# Patient Record
Sex: Female | Born: 1955 | Race: Black or African American | Hispanic: No | Marital: Single | Smoking: Never smoker
Health system: Southern US, Community
[De-identification: ages and names within clinical notes are randomized; demographics above are authoritative.]

## PROBLEM LIST (undated history)

## (undated) DIAGNOSIS — E7800 Pure hypercholesterolemia, unspecified: Secondary | ICD-10-CM

## (undated) DIAGNOSIS — R600 Localized edema: Secondary | ICD-10-CM

## (undated) DIAGNOSIS — E119 Type 2 diabetes mellitus without complications: Secondary | ICD-10-CM

## (undated) DIAGNOSIS — I10 Essential (primary) hypertension: Secondary | ICD-10-CM

---

## 1978-10-20 HISTORY — PX: ABDOMINAL HYSTERECTOMY: SHX81

## 1978-10-20 HISTORY — PX: NEPHRECTOMY: SHX65

## 1998-08-28 ENCOUNTER — Emergency Department (HOSPITAL_COMMUNITY): Admission: EM | Admit: 1998-08-28 | Discharge: 1998-08-28 | Payer: Self-pay | Admitting: Emergency Medicine

## 2000-01-01 ENCOUNTER — Emergency Department (HOSPITAL_COMMUNITY): Admission: EM | Admit: 2000-01-01 | Discharge: 2000-01-01 | Payer: Self-pay | Admitting: Emergency Medicine

## 2001-02-20 ENCOUNTER — Emergency Department (HOSPITAL_COMMUNITY): Admission: EM | Admit: 2001-02-20 | Discharge: 2001-02-20 | Payer: Self-pay | Admitting: *Deleted

## 2003-02-02 ENCOUNTER — Emergency Department (HOSPITAL_COMMUNITY): Admission: EM | Admit: 2003-02-02 | Discharge: 2003-02-02 | Payer: Self-pay | Admitting: Emergency Medicine

## 2009-01-15 ENCOUNTER — Emergency Department (HOSPITAL_COMMUNITY): Admission: EM | Admit: 2009-01-15 | Discharge: 2009-01-15 | Payer: Self-pay | Admitting: Emergency Medicine

## 2014-12-07 DIAGNOSIS — E1165 Type 2 diabetes mellitus with hyperglycemia: Secondary | ICD-10-CM | POA: Insufficient documentation

## 2014-12-13 DIAGNOSIS — E7800 Pure hypercholesterolemia, unspecified: Secondary | ICD-10-CM | POA: Insufficient documentation

## 2014-12-13 DIAGNOSIS — I10 Essential (primary) hypertension: Secondary | ICD-10-CM | POA: Insufficient documentation

## 2016-10-05 ENCOUNTER — Ambulatory Visit (HOSPITAL_COMMUNITY)
Admission: EM | Admit: 2016-10-05 | Discharge: 2016-10-05 | Disposition: A | Discharge status: Home / Self Care | Payer: BLUE CROSS/BLUE SHIELD | Attending: Emergency Medicine | Admitting: Emergency Medicine

## 2016-10-05 ENCOUNTER — Encounter (HOSPITAL_COMMUNITY): Payer: Self-pay | Admitting: Emergency Medicine

## 2016-10-05 DIAGNOSIS — L03213 Periorbital cellulitis: Secondary | ICD-10-CM

## 2016-10-05 HISTORY — DX: Pure hypercholesterolemia, unspecified: E78.00

## 2016-10-05 MED ORDER — CEPHALEXIN 500 MG PO CAPS: 500.0000 mg | ORAL_CAPSULE | Freq: Four times a day (QID) | ORAL | 0 refills | Status: DC

## 2016-10-05 NOTE — Discharge Instructions (Signed)
You have a little infection of the skin of your eyelid. Keflex 4 times a day for 1 week. Continue the cold compresses to help with the swelling. You should see improvement within 24-48 hours of starting antibiotics, hopefully sooner. Follow-up as needed.

## 2016-10-05 NOTE — ED Triage Notes (Signed)
The [facility] with a complaint of right eye swelling for four days. The patient denied any known injury. The patient stated that she has tried benadryl cream and cold compresses.

## 2016-10-05 NOTE — ED Provider Notes (Signed)
[facility]    CSN: [id number] Arrival date & time: 10/05/16  1200     History   Chief Complaint Chief Complaint  Patient presents with  . Eye Problem    HPI Patricia Mooney is a 60 y.o. female.   HPI  She is a 60 year old woman here for evaluation of right eyelid swelling. This is been going on for 4 days and gradually been getting worse. She has tried Benadryl cream, cold compresses, cucumbers, steak, and potatoes without improvement. She denies any redness or pain of the eye. She states the vision is sometimes a little blurred, but not consistently. Denies any pain, but does state it is a little uncomfortable. No fevers. No known injury or trauma.  Past Medical History:  Diagnosis Date  . Diabetes mellitus without complication (HCC)   . Hypercholesteremia     There are no active problems to display for this patient.   Past Surgical History:  Procedure Laterality Date  . NEPHRECTOMY Right 1980  . PARTIAL HYSTERECTOMY  1980    OB History    No data available       Home Medications    Prior to Admission medications   Medication Sig Start Date End Date Taking? Authorizing Provider  cetirizine (ZYRTEC) 10 MG tablet Take 10 mg by mouth daily.   Yes Historical Provider, MD  cholecalciferol (VITAMIN D) 1000 units tablet Take 1,000 Units by mouth daily.   Yes Historical Provider, MD  glipiZIDE (GLUCOTROL) 5 MG tablet Take by mouth daily before breakfast.   Yes Historical Provider, MD  Insulin Detemir (LEVEMIR) 100 UNIT/ML Pen Inject into the skin daily at 10 pm.   Yes Historical Provider, MD  metFORMIN (GLUCOPHAGE) 1000 MG tablet Take 1,000 mg by mouth 2 (two) times daily with a meal.   Yes Historical Provider, MD  simvastatin (ZOCOR) 20 MG tablet Take 20 mg by mouth daily.   Yes Historical Provider, MD  vitamin B-12 (CYANOCOBALAMIN) 100 MCG tablet Take 100 mcg by mouth daily.   Yes Historical Provider, MD  cephALEXin (KEFLEX) 500 MG capsule Take 1 capsule  (500 mg total) by mouth 4 (four) times daily. 10/05/16   Charm RingsErin J Honig, MD    Family History History reviewed. No pertinent family history.  Social History Social History  Substance Use Topics  . Smoking status: Never Smoker  . Smokeless tobacco: Never Used  . Alcohol use No     Allergies   Codeine   Review of Systems Review of Systems As in history of present illness  Physical Exam Triage Vital Signs ED Triage Vitals  Enc Vitals Group     BP      Pulse      Resp      Temp      Temp src      SpO2      Weight      Height      Head Circumference      Peak Flow      Pain Score      Pain Loc      Pain Edu?      Excl. in GC?    No data found.   Updated Vital Signs BP 133/75 (BP Location: Right Arm)   Pulse 85   Temp 98.3 F (36.8 C) (Oral)   Resp 18   SpO2 100%   Visual Acuity Right Eye Distance: 20/25 Left Eye Distance: 20/25 Bilateral Distance: 20/25  Right Eye Near:  Left Eye Near:    Bilateral Near:     Physical Exam  Constitutional: She is oriented to person, place, and time. She appears well-developed and well-nourished. No distress.  Eyes: Conjunctivae and EOM are normal. Pupils are equal, round, and reactive to light. Right eye exhibits no discharge. Left eye exhibits no discharge.  Right lower eyelid is swollen with erythema. Slight involvement of upper eyelid.  Cardiovascular: Normal rate.   Pulmonary/Chest: Effort normal.  Neurological: She is alert and oriented to person, place, and time.     UC Treatments / Results  Labs (all labs ordered are listed, but only abnormal results are displayed) Labs Reviewed - No data to display  EKG  EKG Interpretation None       Radiology No results found.  Procedures Procedures (including critical care time)  Medications Ordered in UC Medications - No data to display   Initial Impression / Assessment and Plan / UC Course  I have reviewed the triage vital signs and the nursing  notes.  Pertinent labs & imaging results that were available during my care of the patient were reviewed by me and considered in my medical decision making (see chart for details).  Clinical Course     Treatment for cellulitis with Keflex. Continue cold compresses. Expect improvement in 24-48 hours. Follow-up as needed.  Final Clinical Impressions(s) / UC Diagnoses   Final diagnoses:  Preseptal cellulitis of right lower eyelid    New Prescriptions New Prescriptions   CEPHALEXIN (KEFLEX) 500 MG CAPSULE    Take 1 capsule (500 mg total) by mouth 4 (four) times daily.     Charm RingsErin J Honig, MD 10/05/16 1235

## 2016-10-06 ENCOUNTER — Ambulatory Visit (HOSPITAL_COMMUNITY)
Admission: EM | Admit: 2016-10-06 | Discharge: 2016-10-06 | Disposition: A | Discharge status: Home / Self Care | Payer: BLUE CROSS/BLUE SHIELD | Attending: Family Medicine | Admitting: Family Medicine

## 2016-10-06 ENCOUNTER — Encounter (HOSPITAL_COMMUNITY): Payer: Self-pay | Admitting: Emergency Medicine

## 2016-10-06 DIAGNOSIS — L03213 Periorbital cellulitis: Secondary | ICD-10-CM | POA: Diagnosis not present

## 2016-10-06 DIAGNOSIS — B308 Other viral conjunctivitis: Secondary | ICD-10-CM | POA: Diagnosis not present

## 2016-10-06 MED ORDER — POLYMYXIN B-TRIMETHOPRIM 10000-0.1 UNIT/ML-% OP SOLN: 1.0000 [drp] | OPHTHALMIC | 0 refills | Status: DC

## 2016-10-06 MED ORDER — SULFAMETHOXAZOLE-TRIMETHOPRIM 800-160 MG PO TABS: 1.0000 | ORAL_TABLET | Freq: Two times a day (BID) | ORAL | 0 refills | Status: DC

## 2016-10-06 MED ORDER — CEFTRIAXONE SODIUM 1 G IJ SOLR
INTRAMUSCULAR | Status: AC
  Filled 2016-10-06: qty 10

## 2016-10-06 MED ORDER — CEFTRIAXONE SODIUM 1 G IJ SOLR
1.0000 g | Freq: Once | INTRAMUSCULAR | Status: AC
  Administered 2016-10-06: 1 g via INTRAMUSCULAR

## 2016-10-06 MED ORDER — LIDOCAINE HCL (PF) 1 % IJ SOLN
INTRAMUSCULAR | Status: AC
  Filled 2016-10-06: qty 2

## 2016-10-06 NOTE — ED Triage Notes (Signed)
Spoke to [employer]bill oxford, np about patient.  Oxford, NP saw patient's eye and that patient is worse today than yesterday and did start medications yesterday.  [facility].

## 2016-10-06 NOTE — Discharge Instructions (Signed)
If swelling worsens then need to go to ED.

## 2016-10-06 NOTE — ED Provider Notes (Signed)
CSN: [id number]     Arrival date & time 10/06/16  1024 History   First MD Initiated Contact with Patient 10/06/16 1101     Chief Complaint  Patient presents with  . Eye Problem   (Consider location/radiation/quality/duration/timing/severity/associated sxs/prior Treatment) Patient was seen yesterday for right periorbital swelling and cellulitis and was started on keflex rx and the cellulitis has worsened.  She states she has discharge or cold from the right eye.   The history is provided by the patient.  Eye Problem  Location:  Right eye Severity:  Mild Onset quality:  Sudden Duration:  2 days Timing:  Constant Progression:  Worsening Chronicity:  New Relieved by:  Nothing Worsened by:  Nothing Associated symptoms: crusting and itching     Past Medical History:  Diagnosis Date  . Diabetes mellitus without complication (HCC)   . Hypercholesteremia    Past Surgical History:  Procedure Laterality Date  . NEPHRECTOMY Right 1980  . PARTIAL HYSTERECTOMY  1980   No family history on file. Social History  Substance Use Topics  . Smoking status: Never Smoker  . Smokeless tobacco: Never Used  . Alcohol use No   OB History    No data available     Review of Systems  Constitutional: Negative.   Eyes: Positive for itching.  Respiratory: Negative.   Cardiovascular: Negative.   Gastrointestinal: Negative.   Endocrine: Negative.   Genitourinary: Negative.   Musculoskeletal: Negative.   Skin:       Right periorbital swelling and erythema.  Allergic/Immunologic: Negative.   Neurological: Negative.   Hematological: Negative.   Psychiatric/Behavioral: Negative.     Allergies  Codeine  Home Medications   Prior to Admission medications   Medication Sig Start Date End Date Taking? Authorizing Provider  cephALEXin (KEFLEX) 500 MG capsule Take 1 capsule (500 mg total) by mouth 4 (four) times daily. 10/05/16   Charm RingsErin J Honig, MD  cetirizine (ZYRTEC) 10 MG tablet Take 10 mg  by mouth daily.    Historical Provider, MD  cholecalciferol (VITAMIN D) 1000 units tablet Take 1,000 Units by mouth daily.    Historical Provider, MD  glipiZIDE (GLUCOTROL) 5 MG tablet Take by mouth daily before breakfast.    Historical Provider, MD  Insulin Detemir (LEVEMIR) 100 UNIT/ML Pen Inject into the skin daily at 10 pm.    Historical Provider, MD  metFORMIN (GLUCOPHAGE) 1000 MG tablet Take 1,000 mg by mouth 2 (two) times daily with a meal.    Historical Provider, MD  simvastatin (ZOCOR) 20 MG tablet Take 20 mg by mouth daily.    Historical Provider, MD  sulfamethoxazole-trimethoprim (BACTRIM DS,SEPTRA DS) 800-160 MG tablet Take 1 tablet by mouth 2 (two) times daily. 10/06/16 10/13/16  Deatra CanterWilliam J Oxford, FNP  trimethoprim-polymyxin b (POLYTRIM) ophthalmic solution Place 1 drop into the right eye every 4 (four) hours. 10/06/16   Deatra CanterWilliam J Oxford, FNP  vitamin B-12 (CYANOCOBALAMIN) 100 MCG tablet Take 100 mcg by mouth daily.    Historical Provider, MD   Meds Ordered and Administered this Visit   Medications  cefTRIAXone (ROCEPHIN) injection 1 g (1 g Intramuscular Given 10/06/16 1131)    BP 153/83 (BP Location: Right Arm) Comment (BP Location): large cuff  Pulse 76   Temp 99.2 F (37.3 C) (Oral)   Resp 20   SpO2 100%  No data found.   Physical Exam  Constitutional: She appears well-developed and well-nourished.  HENT:  Head: Normocephalic and atraumatic.  Mouth/Throat: Oropharynx is clear and moist.  Eyes: EOM are normal. Pupils are equal, round, and reactive to light. Right eye exhibits discharge.  Right conjunctiva with erythema and white discharge.  Cardiovascular: Normal rate, regular rhythm and normal heart sounds.   Pulmonary/Chest: Effort normal and breath sounds normal.  Abdominal: Soft. Bowel sounds are normal.  Skin:  Right periorbital swelling and erythema.  Nursing note and vitals reviewed.   Urgent Care Course   Clinical Course     Procedures (including  critical care time)  Labs Review Labs Reviewed - No data to display  Imaging Review No results found.   Visual Acuity Review  Right Eye Distance:   Left Eye Distance:   Bilateral Distance:    Right Eye Near:   Left Eye Near:    Bilateral Near:         MDM  1. Cellulitis right periorbital area - Continue Keflex rx And add Bactrim DS one po bid x 10 days Rocephin 1 gm IM today  2.  Conjunctivitis right eye - Polytrim gtt's 1-2 gtt's q 4 hours.  Explained to patient to go to ED if right eye worsens.    Deatra CanterWilliam J Oxford, FNP 10/06/16 [phone]

## 2016-10-06 NOTE — ED Triage Notes (Signed)
Right eye swelling is worse than yesterday.  Unable to do visual acuity, patient cannot open this eye.  Area around eye is red, swollen

## 2016-10-07 ENCOUNTER — Inpatient Hospital Stay (HOSPITAL_COMMUNITY)
Admission: EM | Admit: 2016-10-07 | Discharge: 2016-10-09 | DRG: 603 | Disposition: A | Discharge status: Home / Self Care | Payer: BLUE CROSS/BLUE SHIELD | Attending: Internal Medicine | Admitting: Internal Medicine

## 2016-10-07 ENCOUNTER — Encounter (HOSPITAL_COMMUNITY): Payer: Self-pay

## 2016-10-07 ENCOUNTER — Emergency Department (HOSPITAL_COMMUNITY): Payer: BLUE CROSS/BLUE SHIELD

## 2016-10-07 DIAGNOSIS — N183 Chronic kidney disease, stage 3 (moderate): Secondary | ICD-10-CM | POA: Diagnosis present

## 2016-10-07 DIAGNOSIS — E118 Type 2 diabetes mellitus with unspecified complications: Secondary | ICD-10-CM | POA: Diagnosis not present

## 2016-10-07 DIAGNOSIS — E7800 Pure hypercholesterolemia, unspecified: Secondary | ICD-10-CM | POA: Diagnosis present

## 2016-10-07 DIAGNOSIS — E1122 Type 2 diabetes mellitus with diabetic chronic kidney disease: Secondary | ICD-10-CM | POA: Diagnosis present

## 2016-10-07 DIAGNOSIS — Z794 Long term (current) use of insulin: Secondary | ICD-10-CM

## 2016-10-07 DIAGNOSIS — H5711 Ocular pain, right eye: Secondary | ICD-10-CM | POA: Diagnosis present

## 2016-10-07 DIAGNOSIS — E119 Type 2 diabetes mellitus without complications: Secondary | ICD-10-CM | POA: Diagnosis not present

## 2016-10-07 DIAGNOSIS — I10 Essential (primary) hypertension: Secondary | ICD-10-CM

## 2016-10-07 DIAGNOSIS — L03213 Periorbital cellulitis: Secondary | ICD-10-CM | POA: Diagnosis present

## 2016-10-07 DIAGNOSIS — Z885 Allergy status to narcotic agent status: Secondary | ICD-10-CM

## 2016-10-07 DIAGNOSIS — Z905 Acquired absence of kidney: Secondary | ICD-10-CM | POA: Diagnosis not present

## 2016-10-07 DIAGNOSIS — E785 Hyperlipidemia, unspecified: Secondary | ICD-10-CM | POA: Diagnosis not present

## 2016-10-07 HISTORY — DX: Localized edema: R60.0

## 2016-10-07 HISTORY — DX: Type 2 diabetes mellitus without complications: E11.9

## 2016-10-07 LAB — CBC WITH DIFFERENTIAL/PLATELET
BASOS PCT: 0 %
Basophils Absolute: 0 10*3/uL (ref 0.0–0.1)
Eosinophils Absolute: 0.1 10*3/uL (ref 0.0–0.7)
Eosinophils Relative: 1 %
HEMATOCRIT: 37.2 % (ref 36.0–46.0)
HEMOGLOBIN: 12.2 g/dL (ref 12.0–15.0)
LYMPHS ABS: 2.4 10*3/uL (ref 0.7–4.0)
Lymphocytes Relative: 33 %
MCH: 26 pg (ref 26.0–34.0)
MCHC: 32.8 g/dL (ref 30.0–36.0)
MCV: 79.1 fL (ref 78.0–100.0)
MONOS PCT: 5 %
Monocytes Absolute: 0.4 10*3/uL (ref 0.1–1.0)
NEUTROS ABS: 4.5 10*3/uL (ref 1.7–7.7)
NEUTROS PCT: 61 %
Platelets: 230 10*3/uL (ref 150–400)
RBC: 4.7 MIL/uL (ref 3.87–5.11)
RDW: 14.1 % (ref 11.5–15.5)
WBC: 7.4 10*3/uL (ref 4.0–10.5)

## 2016-10-07 LAB — BASIC METABOLIC PANEL
Anion gap: 7 (ref 5–15)
BUN: 15 mg/dL (ref 6–20)
CHLORIDE: 101 mmol/L (ref 101–111)
CO2: 27 mmol/L (ref 22–32)
CREATININE: 1.26 mg/dL — AB (ref 0.44–1.00)
Calcium: 9.8 mg/dL (ref 8.9–10.3)
GFR calc non Af Amer: 45 mL/min — ABNORMAL LOW (ref >60)
GFR, EST AFRICAN AMERICAN: 52 mL/min — AB (ref >60)
Glucose, Bld: 179 mg/dL — ABNORMAL HIGH (ref 65–99)
POTASSIUM: 4.3 mmol/L (ref 3.5–5.1)
Sodium: 135 mmol/L (ref 135–145)

## 2016-10-07 LAB — GLUCOSE, CAPILLARY
Glucose-Capillary: 122 mg/dL — ABNORMAL HIGH (ref 65–99)
Glucose-Capillary: 145 mg/dL — ABNORMAL HIGH (ref 65–99)

## 2016-10-07 MED ORDER — ONDANSETRON HCL 4 MG PO TABS: 4.0000 mg | ORAL_TABLET | Freq: Four times a day (QID) | ORAL | Status: DC | PRN

## 2016-10-07 MED ORDER — VITAMIN B-12 100 MCG PO TABS
100.0000 ug | ORAL_TABLET | Freq: Every day | ORAL | Status: DC
Start: 2016-10-07 — End: 2016-10-09
  Administered 2016-10-08: 100 ug via ORAL
  Filled 2016-10-07 (×3): qty 1

## 2016-10-07 MED ORDER — CLINDAMYCIN PHOSPHATE 600 MG/50ML IV SOLN
600.0000 mg | Freq: Three times a day (TID) | INTRAVENOUS | Status: DC
  Administered 2016-10-08 – 2016-10-09 (×4): 600 mg via INTRAVENOUS
  Filled 2016-10-07 (×7): qty 50

## 2016-10-07 MED ORDER — ENOXAPARIN SODIUM 40 MG/0.4ML ~~LOC~~ SOLN
40.0000 mg | SUBCUTANEOUS | Status: DC
  Administered 2016-10-07: 40 mg via SUBCUTANEOUS
  Filled 2016-10-07: qty 0.4

## 2016-10-07 MED ORDER — ONDANSETRON HCL 4 MG/2ML IJ SOLN: 4.0000 mg | Freq: Four times a day (QID) | INTRAMUSCULAR | Status: DC | PRN

## 2016-10-07 MED ORDER — POLYMYXIN B-TRIMETHOPRIM 10000-0.1 UNIT/ML-% OP SOLN
1.0000 [drp] | OPHTHALMIC | Status: DC
  Administered 2016-10-07 – 2016-10-09 (×10): 1 [drp] via OPHTHALMIC
  Filled 2016-10-07: qty 10

## 2016-10-07 MED ORDER — ACETAMINOPHEN 650 MG RE SUPP: 650.0000 mg | Freq: Four times a day (QID) | RECTAL | Status: DC | PRN

## 2016-10-07 MED ORDER — LORATADINE 10 MG PO TABS
10.0000 mg | ORAL_TABLET | Freq: Every day | ORAL | Status: DC
  Administered 2016-10-07 – 2016-10-08 (×2): 10 mg via ORAL
  Filled 2016-10-07 (×3): qty 1

## 2016-10-07 MED ORDER — SODIUM CHLORIDE 0.9 % IV BOLUS (SEPSIS)
500.0000 mL | Freq: Once | INTRAVENOUS | Status: AC
  Administered 2016-10-07: 500 mL via INTRAVENOUS

## 2016-10-07 MED ORDER — INSULIN ASPART 100 UNIT/ML ~~LOC~~ SOLN
0.0000 [IU] | Freq: Three times a day (TID) | SUBCUTANEOUS | Status: DC
  Administered 2016-10-08: 2 [IU] via SUBCUTANEOUS
  Administered 2016-10-08: 1 [IU] via SUBCUTANEOUS
  Administered 2016-10-08: 2 [IU] via SUBCUTANEOUS
  Administered 2016-10-09: 1 [IU] via SUBCUTANEOUS

## 2016-10-07 MED ORDER — ACETAMINOPHEN 325 MG PO TABS: 650.0000 mg | ORAL_TABLET | Freq: Four times a day (QID) | ORAL | Status: DC | PRN

## 2016-10-07 MED ORDER — CLINDAMYCIN PHOSPHATE 600 MG/50ML IV SOLN
600.0000 mg | Freq: Once | INTRAVENOUS | Status: AC
  Administered 2016-10-07: 600 mg via INTRAVENOUS

## 2016-10-07 MED ORDER — SODIUM CHLORIDE 0.9 % IV SOLN
INTRAVENOUS | Status: DC
  Administered 2016-10-07 – 2016-10-08 (×2): 10 mL/h via INTRAVENOUS

## 2016-10-07 MED ORDER — OXYCODONE HCL 5 MG PO TABS: 5.0000 mg | ORAL_TABLET | ORAL | Status: DC | PRN

## 2016-10-07 MED ORDER — SIMVASTATIN 20 MG PO TABS
20.0000 mg | ORAL_TABLET | Freq: Every day | ORAL | Status: DC
  Administered 2016-10-07 – 2016-10-08 (×2): 20 mg via ORAL
  Filled 2016-10-07 (×3): qty 1

## 2016-10-07 MED ORDER — ALBUTEROL SULFATE (2.5 MG/3ML) 0.083% IN NEBU: 2.5000 mg | INHALATION_SOLUTION | RESPIRATORY_TRACT | Status: DC | PRN

## 2016-10-07 MED ORDER — VITAMIN D 1000 UNITS PO TABS
1000.0000 [IU] | ORAL_TABLET | Freq: Every day | ORAL | Status: DC
Start: 2016-10-07 — End: 2016-10-09
  Administered 2016-10-07 – 2016-10-08 (×2): 1000 [IU] via ORAL
  Filled 2016-10-07 (×3): qty 1

## 2016-10-07 MED ORDER — INSULIN DETEMIR 100 UNIT/ML ~~LOC~~ SOLN
42.0000 [IU] | Freq: Every morning | SUBCUTANEOUS | Status: DC
  Administered 2016-10-08: 42 [IU] via SUBCUTANEOUS
  Filled 2016-10-07 (×2): qty 0.42

## 2016-10-07 MED ORDER — IOPAMIDOL (ISOVUE-300) INJECTION 61%
INTRAVENOUS | Status: AC
  Administered 2016-10-07: 75 mL via INTRAVENOUS
  Filled 2016-10-07: qty 75

## 2016-10-07 MED ORDER — CLINDAMYCIN PHOSPHATE 600 MG/50ML IV SOLN
600.0000 mg | Freq: Once | INTRAVENOUS | Status: AC
  Administered 2016-10-07: 600 mg via INTRAVENOUS
  Filled 2016-10-07: qty 50

## 2016-10-07 NOTE — ED Triage Notes (Signed)
Per Pt, Pt was [facility] after being seen at Urgent Care on Monday and Sunday with no relief from antibiotics. Pt has swelling noted to her right face that has worsened over the past few days. Denies fevers.

## 2016-10-07 NOTE — H&P (Signed)
HISTORY AND PHYSICAL       PATIENT DETAILS Name: Patricia Mooney Age: 60 y.o. Sex: female Date of Birth: Apr 27, 1956 Admit Date: 10/07/2016 ZOX:WRUEA,[id number], MD   Patient coming from: Home   CHIEF COMPLAINT:  Right eye swelling  HPI: Patricia Mooney is a 60 y.o. female with medical history significant of type 2 diabetes, dyslipidemia who presents to the hospital for evaluation of right eye pain and swelling. Per patient approximately this past Friday, she started having swelling around her right eye. This gradually worsened, and she went to a local urgent care twice and was prescribed Keflex one time, the second time Bactrim apparently was added. In spite of 2 oral antibiotics, however right eye swelling continued to worsen, she then went to a local ophthalmologists office who asked her to go to the ED for further evaluation and treatment.  Patient denies any fever She denies any headache Patient denies any nausea, vomiting  ED Course:  CT of the orbits was negative for abscess. CBC/chemistries were unremarkable. Patient was given 1 dose of IV clindamycin-following which-during my evaluation patient claimed that the right eye swelling had already started to improve and she could open her eyes somewhat.  Note: Lives at: Home Mobility:  Independent Chronic Indwelling Foley:no   REVIEW OF SYSTEMS:  Constitutional:   No  weight loss, night sweats,  Fevers, chills, fatigue.  HEENT:    No headaches, Dysphagia,Tooth/dental problems,Sore throat  Cardio-vascular: No chest pain,Orthopnea, PND,lower extremity edema, anasarca, palpitations  GI:  No heartburn, indigestion, abdominal pain, nausea, vomiting, diarrhea, melena or hematochezia  Resp: No shortness of breath, cough, hemoptysis,plueritic chest pain.   Skin:  No rash or lesions.  GU:  No dysuria, change in color of urine, no urgency or frequency.  No flank pain.  Musculoskeletal: No joint pain or  swelling.  No decreased range of motion.  No back pain.  Endocrine: No heat intolerance, no cold intolerance, no polyuria, no polydipsia  Psych: No change in mood or affect. No depression or anxiety.  No memory loss.   ALLERGIES:   Allergies  Allergen Reactions  . Codeine Nausea Only    PAST MEDICAL HISTORY: Past Medical History:  Diagnosis Date  . Diabetes mellitus without complication (HCC)   . Hypercholesteremia     PAST SURGICAL HISTORY: Past Surgical History:  Procedure Laterality Date  . NEPHRECTOMY Right 1980  . PARTIAL HYSTERECTOMY  1980    MEDICATIONS AT HOME: Prior to Admission medications   Medication Sig Start Date End Date Taking? Authorizing Provider  cephALEXin (KEFLEX) 500 MG capsule Take 1 capsule (500 mg total) by mouth 4 (four) times daily. 10/05/16  Yes Charm Rings, MD  cetirizine (ZYRTEC) 10 MG tablet Take 10 mg by mouth daily.   Yes Historical Provider, MD  cholecalciferol (VITAMIN D) 1000 units tablet Take 1,000 Units by mouth daily.   Yes Historical Provider, MD  glipiZIDE (GLUCOTROL) 5 MG tablet Take by mouth daily before breakfast.   Yes Historical Provider, MD  Insulin Detemir (LEVEMIR) 100 UNIT/ML Pen Inject 42 Units into the skin daily at 10 pm.    Yes Historical Provider, MD  metFORMIN (GLUCOPHAGE) 1000 MG tablet Take 1,000 mg by mouth 2 (two) times daily with a meal.   Yes Historical Provider, MD  simvastatin (ZOCOR) 20 MG tablet Take 20 mg by mouth daily.   Yes Historical Provider, MD  sulfamethoxazole-trimethoprim (BACTRIM DS,SEPTRA DS) 800-160 MG tablet Take 1 tablet by  mouth 2 (two) times daily. 10/06/16 10/13/16 Yes Deatra CanterWilliam J Oxford, FNP  trimethoprim-polymyxin b (POLYTRIM) ophthalmic solution Place 1 drop into the right eye every 4 (four) hours. 10/06/16  Yes Deatra CanterWilliam J Oxford, FNP  vitamin B-12 (CYANOCOBALAMIN) 100 MCG tablet Take 100 mcg by mouth daily.   Yes Historical Provider, MD    FAMILY HISTORY: Denies any family history of  head or neck cancer. No family history of CAD.  SOCIAL HISTORY:  reports that she has never smoked. She has never used smokeless tobacco. She reports that she does not drink alcohol or use drugs.  PHYSICAL EXAM: Blood pressure 131/65, pulse 80, temperature 98.2 F (36.8 C), temperature source Oral, resp. rate 17, height 5\' 7"  (1.702 m), weight 81.6 kg (180 lb), SpO2 96 %.  General appearance :Awake, alert, not in any distress. Speech Clear.  Eyes: Right eye periorbital swelling-EOM intact. Sclera white in color. HEENT: Atraumatic and Normocephalic Neck: supple, no JVD. No cervical lymphadenopathy. No thyromegaly Resp:Good air entry bilaterally, no added sounds  CVS: S1 S2 regular, no murmurs.  GI: Bowel sounds present, Non tender and not distended with no gaurding, rigidity or rebound.No organomegaly Extremities: B/L Lower Ext shows no edema, both legs are warm to touch Neurology:  speech clear,Non focal, sensation is grossly intact. Psychiatric: Normal judgment and insight. Alert and oriented x 3. Normal mood. Musculoskeletal:No digital cyanosis Skin:No Rash, warm and dry Wounds:N/A  LABS ON ADMISSION:  I have personally reviewed following labs and imaging studies  CBC:  Recent Labs Lab 10/07/16 1223  WBC 7.4  NEUTROABS 4.5  HGB 12.2  HCT 37.2  MCV 79.1  PLT 230    Basic Metabolic Panel:  Recent Labs Lab 10/07/16 1223  NA 135  K 4.3  CL 101  CO2 27  GLUCOSE 179*  BUN 15  CREATININE 1.26*  CALCIUM 9.8    GFR: Estimated Creatinine Clearance: 50.9 mL/min (by C-G formula based on SCr of 1.26 mg/dL (H)).  Liver Function Tests: No results for input(s): AST, ALT, ALKPHOS, BILITOT, PROT, ALBUMIN in the last 168 hours. No results for input(s): LIPASE, AMYLASE in the last 168 hours. No results for input(s): AMMONIA in the last 168 hours.  Coagulation Profile: No results for input(s): INR, PROTIME in the last 168 hours.  Cardiac Enzymes: No results for  input(s): CKTOTAL, CKMB, CKMBINDEX, TROPONINI in the last 168 hours.  BNP (last 3 results) No results for input(s): PROBNP in the last 8760 hours.  HbA1C: No results for input(s): HGBA1C in the last 72 hours.  CBG: No results for input(s): GLUCAP in the last 168 hours.  Lipid Profile: No results for input(s): CHOL, HDL, LDLCALC, TRIG, CHOLHDL, LDLDIRECT in the last 72 hours.  Thyroid Function Tests: No results for input(s): TSH, T4TOTAL, FREET4, T3FREE, THYROIDAB in the last 72 hours.  Anemia Panel: No results for input(s): VITAMINB12, FOLATE, FERRITIN, TIBC, IRON, RETICCTPCT in the last 72 hours.  Urine analysis: No results found for: COLORURINE, APPEARANCEUR, LABSPEC, PHURINE, GLUCOSEU, HGBUR, BILIRUBINUR, KETONESUR, PROTEINUR, UROBILINOGEN, NITRITE, LEUKOCYTESUR  Sepsis Labs: Lactic Acid, Venous No results found for: LATICACIDVEN   Microbiology: No results found for this or any previous visit (from the past 240 hour(s)).    RADIOLOGIC STUDIES ON ADMISSION: Ct Orbits W Contrast  Result Date: 10/07/2016 CLINICAL DATA:  Right periorbital swelling EXAM: CT ORBITS WITH CONTRAST TECHNIQUE: Multidetector CT images was performed according to the standard protocol following intravenous contrast administration. CONTRAST:  75mL ISOVUE-300 IOPAMIDOL (ISOVUE-300) INJECTION 61% COMPARISON:  None. FINDINGS: Orbits:  Bony orbit is intact.  No fracture or bone lesion. Visualized sinuses: Clear Soft tissues: Preseptal soft tissue swelling right orbital region. No fluid collection. No orbital abscess. Limited intracranial: Negative IMPRESSION: Periorbital cellulitis. No orbital abscess. Negative for sinusitis. Electronically Signed   By: Marlan Palauharles  Clark M.D.   On: 10/07/2016 13:54    EKG:  Not perfomed  ASSESSMENT AND PLAN: Right eye periorbital cellulitis: Continue clindamycin, CT of the orbits reassuring for no abscess. Already clinically improving. Follow clinically, suspect should be  ready for discharge in the next few days if clinical improvement continues.  Insulin-dependent diabetes: Resume Levemir, and SSI.  Dyslipidemia: Continue statin   Further plan will depend as patient's clinical course evolves and further radiologic and laboratory data become available. Patient will be monitored closely.  Above noted plan was discussed with patient/familyface to face at bedside, they were in agreement.   CONSULTS: None  DVT Prophylaxis: Prophylactic Lovenox   Code Status: Full Code  Disposition Plan:  Discharge back home in 2 days, depending on clinical course  Admission status:  Inpatient going to tele  Total time spent  55 minutes.Greater than 50% of this time was spent in counseling, explanation of diagnosis, planning of further management, and coordination of care.  [facility]IM[facility],Shayde Gervacio [facility] Pager [phone]  If 7PM-7AM, please contact night-coverage [email or web address] Password [email or web address] 10/07/2016, 4:11 PM

## 2016-10-07 NOTE — ED Provider Notes (Signed)
[facility]-EMERGENCY DEPT Provider Note   CSN: [id number] Arrival date & time: 10/07/16  1117     History   Chief Complaint Chief Complaint  Patient presents with  . Facial Swelling    HPI Rico Sheehanenny L Yingling is a 60 y.o. female.  HPI Rico Sheehanenny L Panella is a 60 y.o. female with PMH significant for DM who presents with 6 day history of gradual onset, constant, worsening right periorbital swelling with associated eye pain with movement, photophobia, and intermittent purulent discharge.  She has been seen at [facility] twice and has been taking Keflex and Bactrim as prescribed.  She also received IM Rocephin.  She denies any fever, visual changes, or HA.  She was seen at [facility] today who sent her to the ED for further evaluation.  Past Medical History:  Diagnosis Date  . Diabetes mellitus without complication (HCC)   . Hypercholesteremia     Patient Active Problem List   Diagnosis Date Noted  . Periorbital cellulitis of right eye 10/07/2016    Past Surgical History:  Procedure Laterality Date  . NEPHRECTOMY Right 1980  . PARTIAL HYSTERECTOMY  1980    OB History    No data available       Home Medications    Prior to Admission medications   Medication Sig Start Date End Date Taking? Authorizing Provider  cephALEXin (KEFLEX) 500 MG capsule Take 1 capsule (500 mg total) by mouth 4 (four) times daily. 10/05/16   Charm RingsErin J Honig, MD  cetirizine (ZYRTEC) 10 MG tablet Take 10 mg by mouth daily.    Historical Provider, MD  cholecalciferol (VITAMIN D) 1000 units tablet Take 1,000 Units by mouth daily.    Historical Provider, MD  glipiZIDE (GLUCOTROL) 5 MG tablet Take by mouth daily before breakfast.    Historical Provider, MD  Insulin Detemir (LEVEMIR) 100 UNIT/ML Pen Inject into the skin daily at 10 pm.    Historical Provider, MD  metFORMIN (GLUCOPHAGE) 1000 MG tablet Take 1,000 mg by mouth 2 (two) times daily with a meal.    Historical Provider, MD  simvastatin (ZOCOR) 20 MG tablet  Take 20 mg by mouth daily.    Historical Provider, MD  sulfamethoxazole-trimethoprim (BACTRIM DS,SEPTRA DS) 800-160 MG tablet Take 1 tablet by mouth 2 (two) times daily. 10/06/16 10/13/16  Deatra CanterWilliam J Oxford, FNP  trimethoprim-polymyxin b (POLYTRIM) ophthalmic solution Place 1 drop into the right eye every 4 (four) hours. 10/06/16   Deatra CanterWilliam J Oxford, FNP  vitamin B-12 (CYANOCOBALAMIN) 100 MCG tablet Take 100 mcg by mouth daily.    Historical Provider, MD    Family History No family history on file.  Social History Social History  Substance Use Topics  . Smoking status: Never Smoker  . Smokeless tobacco: Never Used  . Alcohol use No     Allergies   Codeine   Review of Systems Review of Systems All other systems negative unless otherwise stated in HPI   Physical Exam Updated Vital Signs BP 153/63 (BP Location: Left Arm)   Pulse 88   Temp 98.2 F (36.8 C) (Oral)   Resp 18   Ht 5\' 7"  (1.702 m)   Wt 81.6 kg   SpO2 100%   BMI 28.19 kg/m   Physical Exam  Constitutional: She is oriented to person, place, and time. She appears well-developed and well-nourished.  HENT:  Head: Normocephalic and atraumatic.  Right Ear: External ear normal.  Left Ear: External ear normal.  Eyes: Conjunctivae and EOM are normal.  Pupils are equal, round, and reactive to light. Lids are everted and swept, no foreign bodies found. Right eye exhibits discharge. Right eye exhibits no exudate. Left eye exhibits no discharge and no exudate. No scleral icterus.  Right periorbital swelling.  Pain with EOMs.   Neck: No tracheal deviation present.  Pulmonary/Chest: Effort normal. No respiratory distress.  Abdominal: She exhibits no distension.  Musculoskeletal: Normal range of motion.  Neurological: She is alert and oriented to person, place, and time.  Skin: Skin is warm and dry.  Psychiatric: She has a normal mood and affect. Her behavior is normal.     ED Treatments / Results  Labs (all labs  ordered are listed, but only abnormal results are displayed) Labs Reviewed  BASIC METABOLIC PANEL - Abnormal; Notable for the following:       Result Value   Glucose, Bld 179 (*)    Creatinine, Ser 1.26 (*)    GFR calc non Af Amer 45 (*)    GFR calc Af Amer 52 (*)    All other components within normal limits  CBC WITH DIFFERENTIAL/PLATELET    EKG  EKG Interpretation None       Radiology Ct Orbits W Contrast  Result Date: 10/07/2016 CLINICAL DATA:  Right periorbital swelling EXAM: CT ORBITS WITH CONTRAST TECHNIQUE: Multidetector CT images was performed according to the standard protocol following intravenous contrast administration. CONTRAST:  75mL ISOVUE-300 IOPAMIDOL (ISOVUE-300) INJECTION 61% COMPARISON:  None. FINDINGS: Orbits:  Bony orbit is intact.  No fracture or bone lesion. Visualized sinuses: Clear Soft tissues: Preseptal soft tissue swelling right orbital region. No fluid collection. No orbital abscess. Limited intracranial: Negative IMPRESSION: Periorbital cellulitis. No orbital abscess. Negative for sinusitis. Electronically Signed   By: Marlan Palauharles  Clark M.D.   On: 10/07/2016 13:54    Procedures Procedures (including critical care time)  Medications Ordered in ED Medications  clindamycin (CLEOCIN) IVPB 600 mg (0 mg Intravenous Stopped 10/07/16 1403)  iopamidol (ISOVUE-300) 61 % injection (75 mLs Intravenous Contrast Given 10/07/16 1337)  sodium chloride 0.9 % bolus 500 mL (500 mLs Intravenous New Bag/Given 10/07/16 1413)     Initial Impression / Assessment and Plan / ED Course  I have reviewed the triage vital signs and the nursing notes.  Pertinent labs & imaging results that were available during my care of the patient were reviewed by me and considered in my medical decision making (see chart for details).  Clinical Course    Patient presents with concern for failed outpatient treatment of periorbital cellulitis.  No visual changes.  CT orbits obtained to  evaluate for abscess, this showed periorbital cellulitis.  Creatinine appears slightly elevated, 1.26,  from baseline of 1-1.1.  Patient given IVF in ED.  Patient started on IV Clindamycin and will need admission for periorbital cellulitis that failed outpatient treatment.   Final Clinical Impressions(s) / ED Diagnoses   Final diagnoses:  Periorbital cellulitis of right eye    New Prescriptions New Prescriptions   No medications on file     Cheri FowlerKayla Tauni Sanks, Cordelia Poche-C 10/07/16 1436    Nira ConnPedro Eduardo Cardama, MD 10/07/16 [phone]

## 2016-10-07 NOTE — ED Notes (Signed)
CT notified patient ready.

## 2016-10-08 DIAGNOSIS — E118 Type 2 diabetes mellitus with unspecified complications: Secondary | ICD-10-CM

## 2016-10-08 LAB — CBC
HCT: 36.7 % (ref 36.0–46.0)
HEMOGLOBIN: 11.6 g/dL — AB (ref 12.0–15.0)
MCH: 25.6 pg — AB (ref 26.0–34.0)
MCHC: 31.6 g/dL (ref 30.0–36.0)
MCV: 81 fL (ref 78.0–100.0)
PLATELETS: 225 10*3/uL (ref 150–400)
RBC: 4.53 MIL/uL (ref 3.87–5.11)
RDW: 14.5 % (ref 11.5–15.5)
WBC: 7.8 10*3/uL (ref 4.0–10.5)

## 2016-10-08 LAB — GLUCOSE, CAPILLARY
GLUCOSE-CAPILLARY: 189 mg/dL — AB (ref 65–99)
Glucose-Capillary: 188 mg/dL — ABNORMAL HIGH (ref 65–99)
Glucose-Capillary: 148 mg/dL — ABNORMAL HIGH (ref 65–99)
Glucose-Capillary: 123 mg/dL — ABNORMAL HIGH (ref 65–99)

## 2016-10-08 LAB — BASIC METABOLIC PANEL
ANION GAP: 8 (ref 5–15)
BUN: 16 mg/dL (ref 6–20)
CALCIUM: 9.5 mg/dL (ref 8.9–10.3)
CO2: 24 mmol/L (ref 22–32)
CREATININE: 1.46 mg/dL — AB (ref 0.44–1.00)
Chloride: 102 mmol/L (ref 101–111)
GFR calc Af Amer: 43 mL/min — ABNORMAL LOW (ref >60)
GFR, EST NON AFRICAN AMERICAN: 37 mL/min — AB (ref >60)
GLUCOSE: 155 mg/dL — AB (ref 65–99)
Potassium: 4.4 mmol/L (ref 3.5–5.1)
Sodium: 134 mmol/L — ABNORMAL LOW (ref 135–145)

## 2016-10-08 NOTE — Progress Notes (Addendum)
PROGRESS NOTE    LEISEL PINETTE  ZOX:[id number] DOB: 14-Dec-1955 DOA: 10/07/2016 PCP: Ocie Cornfield, MD   Chief Complaint  Patient presents with  . Facial Swelling     Brief Narrative:  HPI on 10/07/2016 by Dr. Jeoffrey Massed KITARA HEBB is a 60 y.o. female with medical history significant of type 2 diabetes, dyslipidemia who presents to the hospital for evaluation of right eye pain and swelling. Per patient approximately this past Friday, she started having swelling around her right eye. This gradually worsened, and she went to a local urgent care twice and was prescribed Keflex one time, the second time Bactrim apparently was added. In spite of 2 oral antibiotics, however right eye swelling continued to worsen, she then went to a local ophthalmologists office who asked her to go to the ED for further evaluation and treatment.  Assessment & Plan  Right eye periorbital cellulitis  -CT orbits: Periorbital cellulitis, no abscess or sinusitis -Continue IV clindamycin -Was placed on keflex and Bactrim as an outpatient -Patient feels improved but continues to feel swelling  Insulin-dependent diabetes -Continue Levemir and ISS with CBG monitoing -Hold metformin as patient received IV contrast  Dyslipidemia -Continue statin  AKI vs CKD, stage III -Creatinine upon admission of 1.26, currently 1.46 -No baseline creatinine in Epic -Given history of diabetes, favor chronic kidney disease. However will continue to monitor BMP  DVT Prophylaxis  Lovenox  Code Status: Full  Family Communication: None at bedside  Disposition Plan: Admitted, possibly discharge home in 1-2 days if improved.  Consultants None  Procedures  none  Antibiotics   Anti-infectives    Start     Dose/Rate Route Frequency Ordered Stop   10/07/16 2100  clindamycin (CLEOCIN) IVPB 600 mg     600 mg 100 mL/hr over 30 Minutes Intravenous  Once 10/07/16 1827 10/07/16 2053   10/07/16 1700  clindamycin (CLEOCIN)  IVPB 600 mg     600 mg 100 mL/hr over 30 Minutes Intravenous Every 8 hours 10/07/16 1651     10/07/16 1245  clindamycin (CLEOCIN) IVPB 600 mg     600 mg 100 mL/hr over 30 Minutes Intravenous  Once 10/07/16 1240 10/07/16 1403      Subjective:   Selmer Dominion seen and examined today.  Patient states she is able to open her eye. Does continue to have swelling and some pain on the right side of her face and eye. Denies chest pain, shortness breath, abdominal pain, nausea or vomiting, diarrhea constipation, headache or dizziness.   Objective:   Vitals:   10/07/16 1615 10/07/16 1702 10/07/16 2123 10/08/16 0626  BP: 134/68 127/83 123/60 (!) 128/54  Pulse: 80 82 82 85  Resp:  16 16 16   Temp:  98.6 F (37 C) 98.7 F (37.1 C) 98.5 F (36.9 C)  TempSrc:  Oral Oral Oral  SpO2: 95% 99% 94% 100%  Weight:      Height:        Intake/Output Summary (Last 24 hours) at 10/08/16 1330 Last data filed at 10/08/16 1000  Gross per 24 hour  Intake             2070 ml  Output                0 ml  Net             2070 ml   Filed Weights   10/07/16 1121  Weight: 81.6 kg (180 lb)    Exam  General: Well developed,  well nourished, NAD, appears stated age  HEENT: NCAT, mucous membranes moist. Right periorbital edema, no drainage  Neck: Supple, no JVD, no masses  Cardiovascular: S1 S2 auscultated, no rubs, murmurs or gallops. Regular rate and rhythm.  Respiratory: Clear to auscultation bilaterally with equal chest rise  Abdomen: Soft, nontender, nondistended, + bowel sounds  Extremities: warm dry without cyanosis clubbing or edema  Neuro: AAOx3, nonfocal  Psych: Normal affect and demeanor    Data Reviewed: I have personally reviewed following labs and imaging studies  CBC:  Recent Labs Lab 10/07/16 1223 10/08/16 0420  WBC 7.4 7.8  NEUTROABS 4.5  --   HGB 12.2 11.6*  HCT 37.2 36.7  MCV 79.1 81.0  PLT 230 225   Basic Metabolic Panel:  Recent Labs Lab 10/07/16 1223  10/08/16 0420  NA 135 134*  K 4.3 4.4  CL 101 102  CO2 27 24  GLUCOSE 179* 155*  BUN 15 16  CREATININE 1.26* 1.46*  CALCIUM 9.8 9.5   GFR: Estimated Creatinine Clearance: 43.9 mL/min (by C-G formula based on SCr of 1.46 mg/dL (H)). Liver Function Tests: No results for input(s): AST, ALT, ALKPHOS, BILITOT, PROT, ALBUMIN in the last 168 hours. No results for input(s): LIPASE, AMYLASE in the last 168 hours. No results for input(s): AMMONIA in the last 168 hours. Coagulation Profile: No results for input(s): INR, PROTIME in the last 168 hours. Cardiac Enzymes: No results for input(s): CKTOTAL, CKMB, CKMBINDEX, TROPONINI in the last 168 hours. BNP (last 3 results) No results for input(s): PROBNP in the last 8760 hours. HbA1C: No results for input(s): HGBA1C in the last 72 hours. CBG:  Recent Labs Lab 10/07/16 1700 10/07/16 2229 10/08/16 0750 10/08/16 1203  GLUCAP 122* 145* 189* 188*   Lipid Profile: No results for input(s): CHOL, HDL, LDLCALC, TRIG, CHOLHDL, LDLDIRECT in the last 72 hours. Thyroid Function Tests: No results for input(s): TSH, T4TOTAL, FREET4, T3FREE, THYROIDAB in the last 72 hours. Anemia Panel: No results for input(s): VITAMINB12, FOLATE, FERRITIN, TIBC, IRON, RETICCTPCT in the last 72 hours. Urine analysis: No results found for: COLORURINE, APPEARANCEUR, LABSPEC, PHURINE, GLUCOSEU, HGBUR, BILIRUBINUR, KETONESUR, PROTEINUR, UROBILINOGEN, NITRITE, LEUKOCYTESUR Sepsis Labs: @LABRCNTIP (procalcitonin:4,lacticidven:4)  )No results found for this or any previous visit (from the past 240 hour(s)).    Radiology Studies: Ct Orbits W Contrast  Result Date: 10/07/2016 CLINICAL DATA:  Right periorbital swelling EXAM: CT ORBITS WITH CONTRAST TECHNIQUE: Multidetector CT images was performed according to the standard protocol following intravenous contrast administration. CONTRAST:  75mL ISOVUE-300 IOPAMIDOL (ISOVUE-300) INJECTION 61% COMPARISON:  None. FINDINGS:  Orbits:  Bony orbit is intact.  No fracture or bone lesion. Visualized sinuses: Clear Soft tissues: Preseptal soft tissue swelling right orbital region. No fluid collection. No orbital abscess. Limited intracranial: Negative IMPRESSION: Periorbital cellulitis. No orbital abscess. Negative for sinusitis. Electronically Signed   By: Marlan Palauharles  Clark M.D.   On: 10/07/2016 13:54     Scheduled Meds: . cholecalciferol  1,000 Units Oral Daily  . clindamycin (CLEOCIN) IV  600 mg Intravenous Q8H  . enoxaparin (LOVENOX) injection  40 mg Subcutaneous Q24H  . insulin aspart  0-9 Units Subcutaneous TID WC  . insulin detemir  42 Units Subcutaneous q morning - 10a  . loratadine  10 mg Oral Daily  . simvastatin  20 mg Oral Daily  . trimethoprim-polymyxin b  1 drop Right Eye Q4H  . vitamin B-12  100 mcg Oral Daily   Continuous Infusions: . sodium chloride 10 mL/hr (10/07/16 2358)  LOS: 1 day   Time Spent in minutes   30 minutes  Rosena Bartle D.O. on 10/08/2016 at 1:30 PM  Between 7am to 7pm - Pager - [phone]  After 7pm go to [email or web address] - password [email or web address]  And look for the night coverage person covering for me after hours  [facility] Office  [phone]

## 2016-10-09 DIAGNOSIS — N183 Chronic kidney disease, stage 3 (moderate): Secondary | ICD-10-CM

## 2016-10-09 LAB — GLUCOSE, CAPILLARY: Glucose-Capillary: 144 mg/dL — ABNORMAL HIGH (ref 65–99)

## 2016-10-09 LAB — BASIC METABOLIC PANEL
ANION GAP: 9 (ref 5–15)
BUN: 20 mg/dL (ref 6–20)
CALCIUM: 9.6 mg/dL (ref 8.9–10.3)
CO2: 24 mmol/L (ref 22–32)
CREATININE: 1.49 mg/dL — AB (ref 0.44–1.00)
Chloride: 103 mmol/L (ref 101–111)
GFR, EST AFRICAN AMERICAN: 42 mL/min — AB (ref >60)
GFR, EST NON AFRICAN AMERICAN: 36 mL/min — AB (ref >60)
GLUCOSE: 119 mg/dL — AB (ref 65–99)
Potassium: 4.4 mmol/L (ref 3.5–5.1)
Sodium: 136 mmol/L (ref 135–145)

## 2016-10-09 MED ORDER — CLINDAMYCIN HCL 300 MG PO CAPS: 300.0000 mg | ORAL_CAPSULE | Freq: Three times a day (TID) | ORAL | 0 refills | Status: DC

## 2016-10-09 MED ORDER — METFORMIN HCL 1000 MG PO TABS: 1000.0000 mg | ORAL_TABLET | Freq: Two times a day (BID) | ORAL | Status: DC

## 2016-10-09 NOTE — Progress Notes (Signed)
Reviewed AVS discharge instructions with patient . Pt reminded that she had a prescription from her pharmacy to pickup. Pt denies any questions. Staff assisted pt to her transportation.

## 2016-10-09 NOTE — Discharge Summary (Signed)
Physician Discharge Summary  Patricia Mooney WUJ:[id number] DOB: [birth year] DOA: 10/07/2016  PCP: Patricia Cornfield, MD  Admit date: 10/07/2016 Discharge date: 10/09/2016  Time spent: 45 minutes  Recommendations for Outpatient Follow-up:  Patient will be discharged to home.  Patient will need to follow up with primary care provider within one week of discharge, repeat BMP.  Patient should continue medications as prescribed.  Patient should follow a carb modified diet.   Discharge Diagnoses:  Right eye periorbital cellulitis  Insulin-dependent diabetes Dyslipidemia AKI vs CKD, stage III  Discharge Condition: Stable  Diet recommendation: carb modified  Filed Weights   10/07/16 1121  Weight: 81.6 kg (180 lb)    History of present illness:  on 10/07/2016 by Patricia Mooney a 60 y.o.femalewith medical history significant of type 2 diabetes, dyslipidemia who presents to the hospital for evaluation of right eye pain and swelling. Per patient approximately this past Friday, she started having swelling around her right eye. This gradually worsened, and she went to a local urgent care twice and was prescribed Keflex one time, the second time Bactrim apparently was added. In spite of 2 oral antibiotics, however right eye swelling continued to worsen, she then went to a local ophthalmologists office who asked her to go to the ED for further evaluation and treatment.   Hospital Course:  Right eye periorbital cellulitis  -Improving, edema and cellulitis improving.  No complaints of pain with eye movement -CT orbits: Periorbital cellulitis, no abscess or sinusitis -Initially placed on IV clindamycin and responded well. -Was placed on keflex and Bactrim as an outpatient  Insulin-dependent diabetes -Continue Levemir and glipizide -Hold metformin as patient received IV contrast- may resume on 10/10/2016  Dyslipidemia -Continue statin  AKI vs CKD, stage  III -Creatinine upon admission of 1.26, currently 1.49 -No baseline creatinine in Epic -Given history of diabetes, favor chronic kidney disease. -Repeat BMP in one week  Procedures: None  Consultations: None  Discharge Exam: Vitals:   10/08/16 2205 10/09/16 0500  BP: (!) 124/59 (!) 108/57  Pulse: 72 72  Resp: 16 16  Temp: 98.1 F (36.7 C) 98.6 F (37 C)   Patricia Mooney seen and examined today.  Feels swelling around her eye as improved.  Denies eye pain. Denies chest pain, shortness breath, abdominal pain, nausea or vomiting, diarrhea constipation, headache or dizziness.   Exam  General: Well developed, well nourished, NAD, appears stated age  HEENT: NCAT, mucous membranes moist. Right periorbital edema improving, no drainage  Cardiovascular: S1 S2 auscultated, no murmurs, RRR  Respiratory: Clear to auscultation bilaterally with equal chest rise  Abdomen: Soft, nontender, nondistended, + bowel sounds  Extremities: warm dry without cyanosis clubbing or edema  Neuro: AAOx3, nonfocal  Psych: Normal affect and demeanor, pleasant  Discharge Instructions Discharge Instructions    Discharge instructions    Complete by:  As directed    Patient will be discharged to home.  Patient will need to follow up with primary care provider within one week of discharge, repeat BMP.  Restart metformin on 10/10/2016. Patient should continue medications as prescribed.  Patient should follow a carb modified diet.     Current Discharge Medication List    START taking these medications   Details  clindamycin (CLEOCIN) 300 MG capsule Take 1 capsule (300 mg total) by mouth 3 (three) times daily. Qty: 24 capsule, Refills: 0      CONTINUE these medications which have CHANGED   Details  metFORMIN (GLUCOPHAGE) 1000 MG tablet  Take 1 tablet (1,000 mg total) by mouth 2 (two) times daily with a meal. Restart on 10/10/2016      CONTINUE these medications which have NOT CHANGED   Details   cetirizine (ZYRTEC) 10 MG tablet Take 10 mg by mouth daily.    cholecalciferol (VITAMIN D) 1000 units tablet Take 1,000 Units by mouth daily.    glipiZIDE (GLUCOTROL) 5 MG tablet Take by mouth daily before breakfast.    Insulin Detemir (LEVEMIR) 100 UNIT/ML Pen Inject 42 Units into the skin daily at 10 pm.     simvastatin (ZOCOR) 20 MG tablet Take 20 mg by mouth daily.    trimethoprim-polymyxin b (POLYTRIM) ophthalmic solution Place 1 drop into the right eye every 4 (four) hours. Qty: 10 mL, Refills: 0    vitamin B-12 (CYANOCOBALAMIN) 100 MCG tablet Take 100 mcg by mouth daily.      STOP taking these medications     cephALEXin (KEFLEX) 500 MG capsule      sulfamethoxazole-trimethoprim (BACTRIM DS,SEPTRA DS) 800-160 MG tablet        Allergies  Allergen Reactions  . Codeine Nausea Only   Follow-up Information    DOERR,MONICA, MD. Schedule an appointment as soon as possible for a visit in 1 week(s).   Specialty:  Internal Medicine Why:  hospital follow up Contact information: [address] [place] [address] [phone]            The results of significant diagnostics from this hospitalization (including imaging, microbiology, ancillary and laboratory) are listed below for reference.    Significant Diagnostic Studies: Ct Orbits W Contrast  Result Date: 10/07/2016 CLINICAL DATA:  Right periorbital swelling EXAM: CT ORBITS WITH CONTRAST TECHNIQUE: Multidetector CT images was performed according to the standard protocol following intravenous contrast administration. CONTRAST:  75mL ISOVUE-300 IOPAMIDOL (ISOVUE-300) INJECTION 61% COMPARISON:  None. FINDINGS: Orbits:  Bony orbit is intact.  No fracture or bone lesion. Visualized sinuses: Clear Soft tissues: Preseptal soft tissue swelling right orbital region. No fluid collection. No orbital abscess. Limited intracranial: Negative IMPRESSION: Periorbital cellulitis. No orbital abscess. Negative for sinusitis.  Electronically Signed   By: Marlan Palauharles  Clark M.D.   On: 10/07/2016 13:54    Microbiology: No results found for this or any previous visit (from the past 240 hour(s)).   Labs: Basic Metabolic Panel:  Recent Labs Lab 10/07/16 1223 10/08/16 0420 10/09/16 0338  NA 135 134* 136  K 4.3 4.4 4.4  CL 101 102 103  CO2 27 24 24   GLUCOSE 179* 155* 119*  BUN 15 16 20   CREATININE 1.26* 1.46* 1.49*  CALCIUM 9.8 9.5 9.6   Liver Function Tests: No results for input(s): AST, ALT, ALKPHOS, BILITOT, PROT, ALBUMIN in the last 168 hours. No results for input(s): LIPASE, AMYLASE in the last 168 hours. No results for input(s): AMMONIA in the last 168 hours. CBC:  Recent Labs Lab 10/07/16 1223 10/08/16 0420  WBC 7.4 7.8  NEUTROABS 4.5  --   HGB 12.2 11.6*  HCT 37.2 36.7  MCV 79.1 81.0  PLT 230 225   Cardiac Enzymes: No results for input(s): CKTOTAL, CKMB, CKMBINDEX, TROPONINI in the last 168 hours. BNP: BNP (last 3 results) No results for input(s): BNP in the last 8760 hours.  ProBNP (last 3 results) No results for input(s): PROBNP in the last 8760 hours.  CBG:  Recent Labs Lab 10/08/16 0750 10/08/16 1203 10/08/16 1718 10/08/16 2204 10/09/16 0747  GLUCAP 189* 188* 148* 123* 144*  SignedEdsel Petrin:  Clayson Riling  [facility] 10/09/2016, 10:24 AM

## 2016-10-09 NOTE — Discharge Instructions (Signed)
Orbital Cellulitis Introduction Orbital cellulitis is an infection in the eye socket (orbit) and the tissues that surround the eye. The infection can spread to the eyelids, eyebrow area, and cheek. It can also cause a pocket of pus to develop around the eye (orbital abscess). In severe cases, the infection can spread to the brain. Orbital cellulitis is a medical emergency. What are the causes? The most common cause of this condition is a bacterial infection. The infection usually spreads to the eye socket from another part of the body. The infection may start in:  The nose or sinuses.  The eyelids.  Facial skin.  The bloodstream. What increases the risk? This condition is more likely to develop in people who have recently had one of the following:  Upper respiratory infection.  Sinus infection.  Eyelid or facial infection.  Eye injury.  Infection that affects the entire body or the bloodstream (systemic infection). What are the signs or symptoms? Symptoms of this condition usually start quickly. Symptoms include:  Eye pain that gets worse with eye movement.  Swelling around the eye.  Eye redness.  Bulging of the eye.  Inability to move the eye.  Double vision.  Fever. How is this diagnosed? This condition may be diagnosed based on your symptoms and an eye exam. You may also have tests to confirm the diagnosis and to check for an orbital abscess. Other tests (cultures) may be done to find out what type of bacteria is causing the infection. Tests may include:  Complete blood count (CBC).  Blood culture.  Nose, sinus, or throat culture.  Imaging studies such as a CT scan or MRI. How is this treated? This condition is usually treated in a hospital. Antibiotic medicines are given directly into a vein through an IV tube.  At first, you may get IV antibiotics to kill bacteria that often cause orbital cellulitis (broad spectrum antibiotics).  Your medicine may be  changed if cultures suggest that another antibiotic would be better.  If the IV antibiotics are working to treat your infection, you may be switched to oral antibiotics and allowed to go home.  In some cases, surgery may be needed to drain an orbital abscess. Follow these instructions at home:  Take medicines only as directed by your health care provider.  Take your antibiotic medicine as directed by your health care provider. Finish the antibiotic even if you start to feel better.  Return to your normal activities as directed by your health care provider. Ask your health care provider what activities are safe for you.  Keep all follow-up visits as directed by your health care provider. This is important. Get help right away if:  Your eye pain or swelling returns or it gets worse.  You have any changes in your vision.  You have a fever. This information is not intended to replace advice given to you by your health care provider. Make sure you discuss any questions you have with your health care provider. Document Released: 09/30/2001 Document Revised: 03/13/2016 Document Reviewed: 10/02/2014  2017 [employer]

## 2017-10-18 IMAGING — CT CT ORBITS W/ CM
3 series · 18 of 47 positions shown, 21 images · IV contrast (Iodine)
Comparison: None.

CLINICAL DATA: Right periorbital swelling

EXAM:
CT ORBITS WITH CONTRAST
TECHNIQUE: Multidetector CT images was performed according to the standard
protocol following intravenous contrast administration.
CONTRAST:  75mL [id number] IOPAMIDOL ([id number]) INJECTION 61%

[Series 201: orbits st · axial · 0.39mm/px · z∈[+57,+125]mm · 12 of 40 slices shown, 15 images]
[im 3/40  brain]
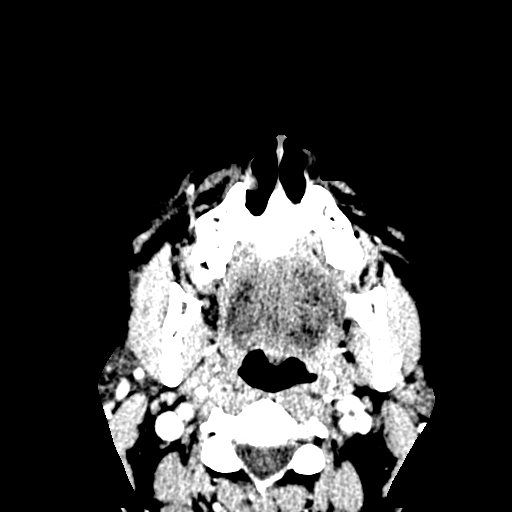
[im 3/40  bone]
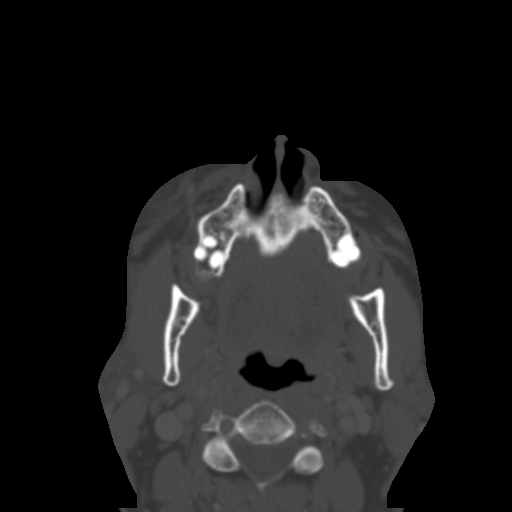
[im 6/40  bone]
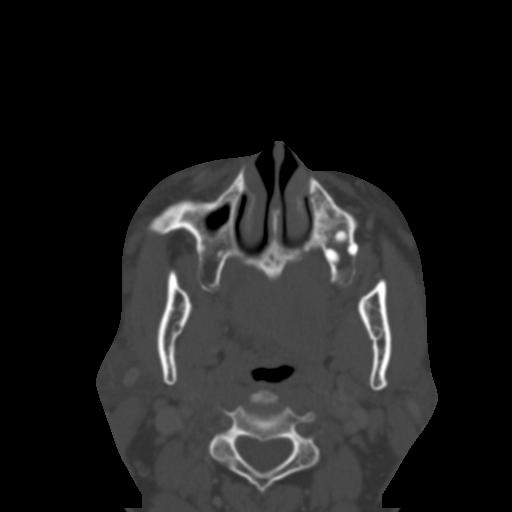
[im 9/40  bone]
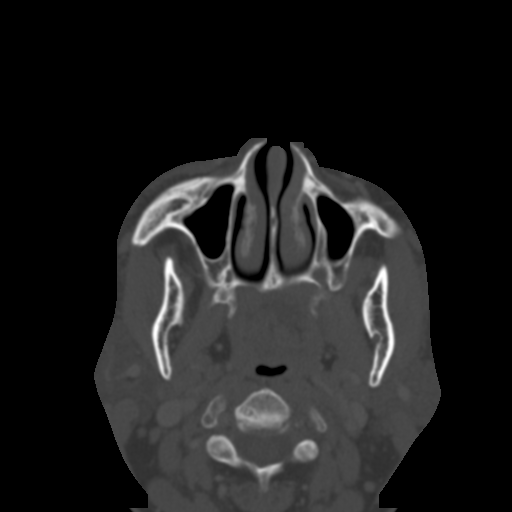
[im 13/40  bone]
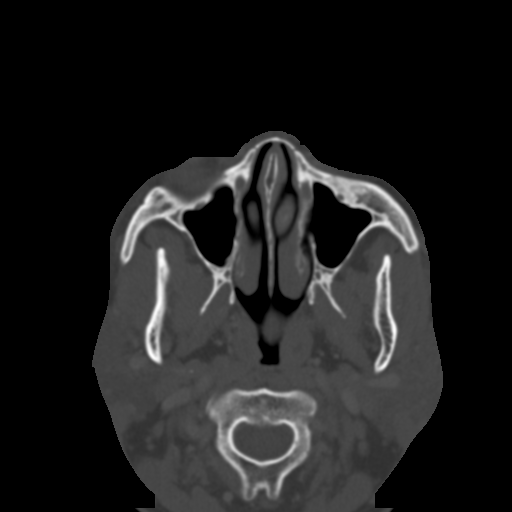
[im 15/40  brain]
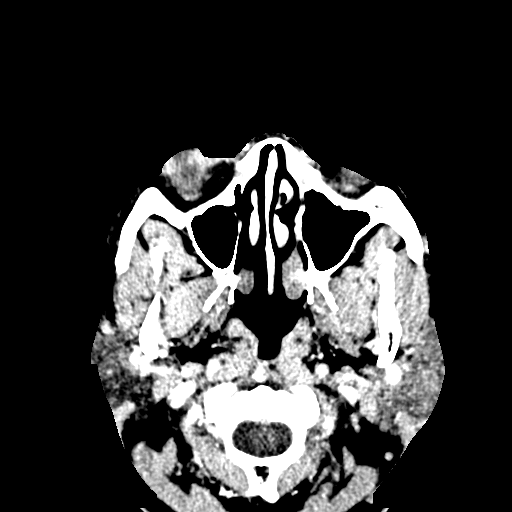
[im 15/40  bone]
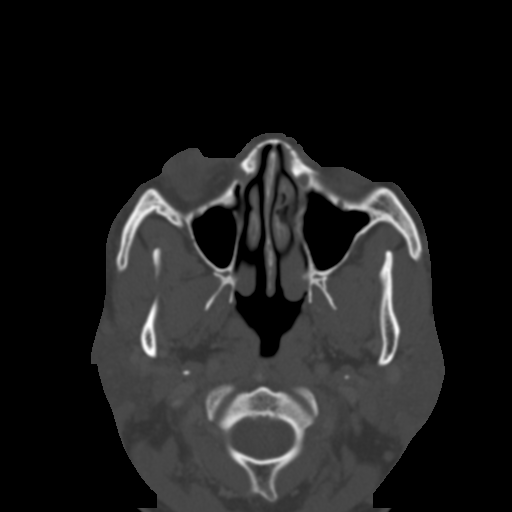
[im 18/40  bone]
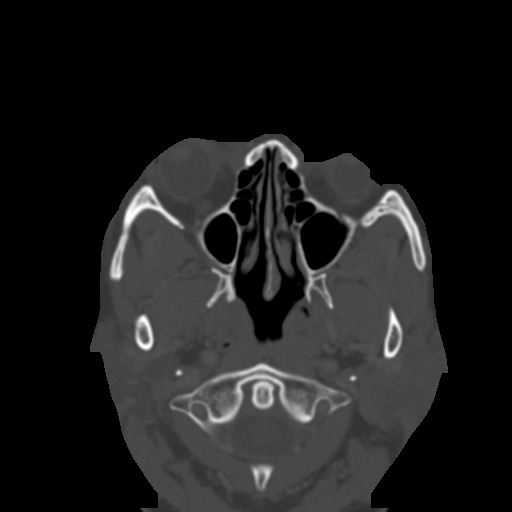
[im 22/40  bone]
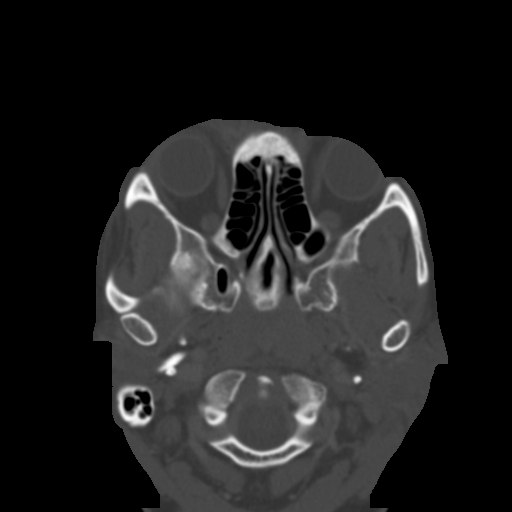
[im 25/40  bone]
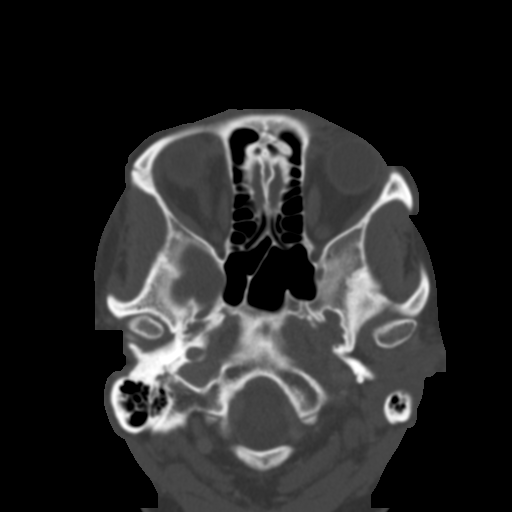
[im 27/40  brain]
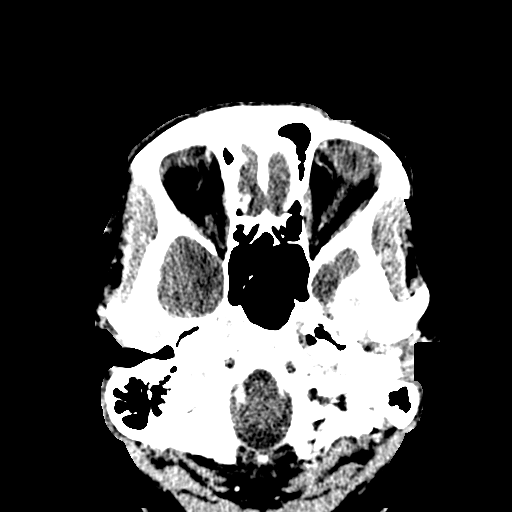
[im 27/40  bone]
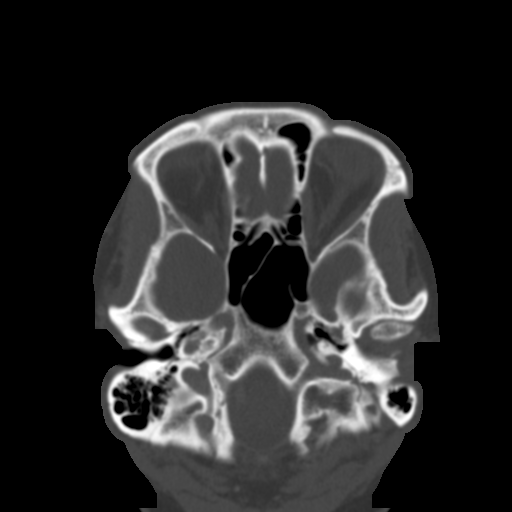
[im 31/40  bone]
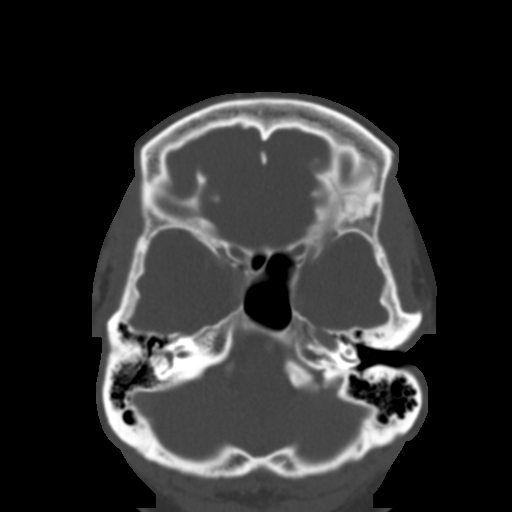
[im 34/40  bone]
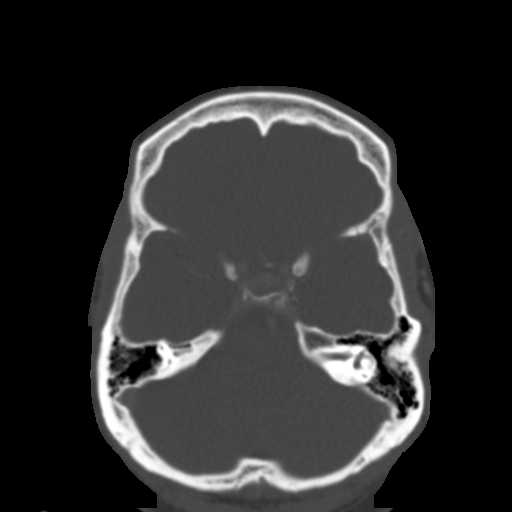
[im 37/40  bone]
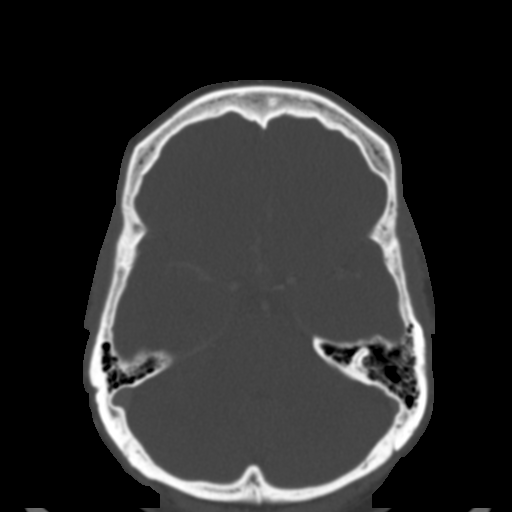

[Series 203: coronal std · coronal · 0.34mm/px · 3 of 87 slices shown]
[im 29/87  bone]
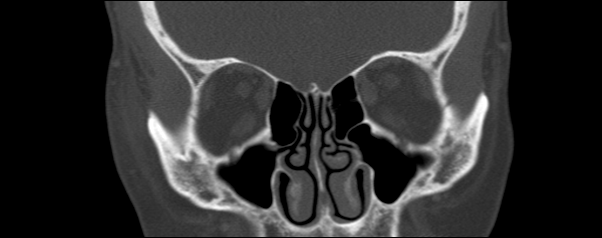
[im 39/87  bone]
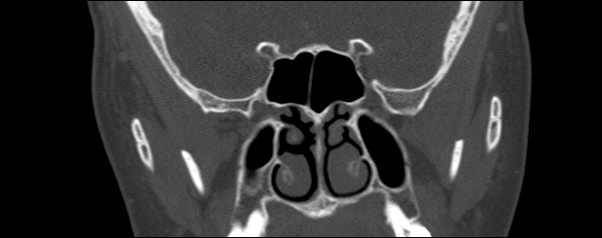
[im 48/87  bone]
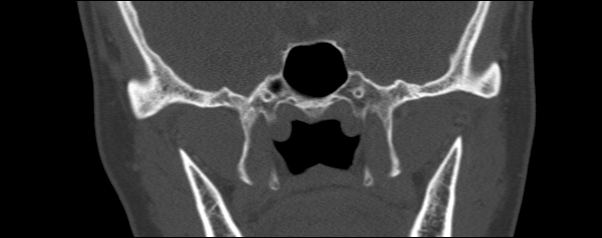

[Series 204: sagittal std · sagittal · 0.34mm/px · 3 of 99 slices shown]
[im 33/99  bone]
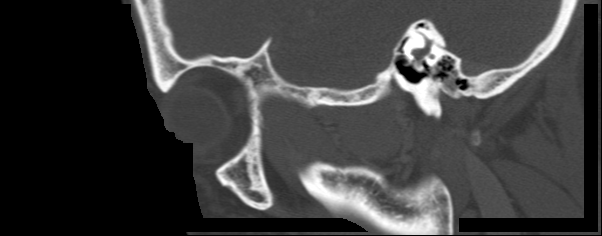
[im 50/99  bone]
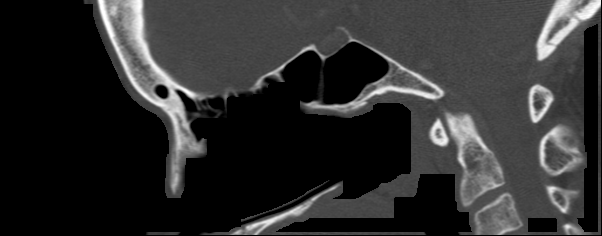
[im 66/99  bone]
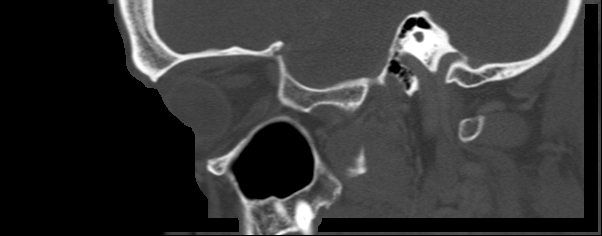

[18 of 47 positions shown; findings below may reference images not displayed]

FINDINGS: Orbits:  Bony orbit is intact.  No fracture or bone lesion.

Visualized sinuses: Clear

Soft tissues: Preseptal soft tissue swelling right orbital region.
No fluid collection. No orbital abscess.

Limited intracranial: Negative
IMPRESSION: Periorbital cellulitis. No orbital abscess. Negative for sinusitis.

## 2020-03-07 ENCOUNTER — Ambulatory Visit (INDEPENDENT_AMBULATORY_CARE_PROVIDER_SITE_OTHER)
Admission: RE | Admit: 2020-03-07 | Discharge: 2020-03-07 | Disposition: A | Discharge status: Home / Self Care | Payer: Medicare Other | Source: Ambulatory Visit

## 2020-03-07 DIAGNOSIS — M5441 Lumbago with sciatica, right side: Secondary | ICD-10-CM | POA: Diagnosis not present

## 2020-03-07 DIAGNOSIS — M5442 Lumbago with sciatica, left side: Secondary | ICD-10-CM | POA: Diagnosis not present

## 2020-03-07 MED ORDER — METHOCARBAMOL 500 MG PO TABS: 500.0000 mg | ORAL_TABLET | Freq: Two times a day (BID) | ORAL | 0 refills | Status: DC

## 2020-03-07 NOTE — ED Provider Notes (Signed)
Virtual Visit via Video Note:  [name]  initiated request for Telemedicine visit with [facility] team. I connected with Patricia Mooney  on 03/07/2020 at 11:11 AM  for a synchronized telemedicine visit using a video enabled HIPPA compliant telemedicine application. I verified that I am speaking with Patricia Mooney  using two identifiers. [name], NP  was physically located in a [facility] Urgent care site and GLADYSE CORVIN was located at a different location.   The limitations of evaluation and management by telemedicine as well as the availability of in-person appointments were discussed. Patient was informed that she  may incur a bill ( including co-pay) for this virtual visit encounter. Patricia Mooney  expressed understanding and gave verbal consent to proceed with virtual visit.     History of Present Illness:Patricia Mooney  is a 64 y.o. female presents for evaluation of lower back pain which radiates down the back of both legs x 1 day.  No falls or injury.  She denies numbness but feels weak in her legs when she walks. She denies rash, lesions, redness, bruising.  She denies fever, chills, abdominal pain, dysuria, or other symptoms.  Treatment attempted with Tylenol and massage.  Patient is unable to take NSAIDs due to kidney disease; she is also diabetic.   Allergies  Allergen Reactions  . Codeine Nausea Only     Past Medical History:  Diagnosis Date  . Hypercholesteremia   . Periorbital edema of right eye admitted 10/07/2016  . Type II diabetes mellitus (HCC)      Social History   Tobacco Use  . Smoking status: Never Smoker  . Smokeless tobacco: Never Used  Substance Use Topics  . Alcohol use: No  . Drug use: No   ROS: as stated in HPI.  All other systems reviewed and negative.      Observations/Objective: Physical Exam  VITALS: Patient denies fever. GENERAL: Alert, appears well and in no acute distress. HEENT: Atraumatic. NECK: Normal  movements of the head and neck. CARDIOPULMONARY: No increased WOB. Speaking in clear sentences. I:E ratio WNL.  MS: Moves all visible extremities without noticeable abnormality. PSYCH: Pleasant and cooperative, well-groomed. Speech normal rate and rhythm. Affect is appropriate. Insight and judgement are appropriate. Attention is focused, linear, and appropriate.  NEURO: CN grossly intact. Oriented as arrived to appointment on time with no prompting. Moves both UE equally.  SKIN: No obvious lesions, wounds, erythema, or cyanosis noted on face or hands.   Assessment and Plan:    ICD-10-CM   1. Acute bilateral low back pain with bilateral sciatica  M54.42    M54.41        Follow Up Instructions: Patient is unable to take NSAIDs.  Treating with Robaxin.  Discussed precautions for drowsiness with Robaxin.  Directed patient to follow-up with her PCP if her symptoms or not improving.  Patient agrees to plan of care.      I discussed the assessment and treatment plan with the patient. The patient was provided an opportunity to ask questions and all were answered. The patient agreed with the plan and demonstrated an understanding of the instructions.   The patient was advised to call back or seek an in-person evaluation if the symptoms worsen or if the condition fails to improve as anticipated.      [name], NP  03/07/2020 11:11 AM         [name], NP 03/07/20  1111  

## 2020-03-07 NOTE — Discharge Instructions (Addendum)
Take the Robaxin as directed; drive, operate machinery, or drink alcohol with this medication as it will cause drowsiness.    Follow-up with your primary care provider if your symptoms are not improving.

## 2022-05-17 LAB — POCT GLYCOSYLATED HEMOGLOBIN (HGB A1C): Hemoglobin-A1c: 7.9

## 2022-05-17 LAB — GLUCOSE, POCT (MANUAL RESULT ENTRY): POC Glucose: 114 mg/dl — AB (ref 70–99)

## 2023-04-04 ENCOUNTER — Emergency Department (HOSPITAL_COMMUNITY): Payer: Medicare (Managed Care)

## 2023-04-04 ENCOUNTER — Inpatient Hospital Stay (HOSPITAL_COMMUNITY): Payer: Medicare (Managed Care)

## 2023-04-04 ENCOUNTER — Encounter (HOSPITAL_COMMUNITY): Payer: Self-pay

## 2023-04-04 ENCOUNTER — Other Ambulatory Visit: Payer: Self-pay

## 2023-04-04 ENCOUNTER — Encounter (HOSPITAL_COMMUNITY)
Admission: EM | Disposition: A | Discharge status: Home / Self Care | Payer: Self-pay | Source: Home / Self Care | Attending: Cardiology

## 2023-04-04 ENCOUNTER — Inpatient Hospital Stay (HOSPITAL_COMMUNITY)
Admission: EM | Admit: 2023-04-04 | Discharge: 2023-04-10 | DRG: 321 | Disposition: A | Discharge status: Home / Self Care | Payer: Medicare (Managed Care) | Attending: Cardiology | Admitting: Cardiology

## 2023-04-04 DIAGNOSIS — I252 Old myocardial infarction: Secondary | ICD-10-CM | POA: Diagnosis not present

## 2023-04-04 DIAGNOSIS — I2510 Atherosclerotic heart disease of native coronary artery without angina pectoris: Secondary | ICD-10-CM | POA: Diagnosis present

## 2023-04-04 DIAGNOSIS — Z794 Long term (current) use of insulin: Secondary | ICD-10-CM

## 2023-04-04 DIAGNOSIS — Z8249 Family history of ischemic heart disease and other diseases of the circulatory system: Secondary | ICD-10-CM | POA: Diagnosis not present

## 2023-04-04 DIAGNOSIS — I130 Hypertensive heart and chronic kidney disease with heart failure and stage 1 through stage 4 chronic kidney disease, or unspecified chronic kidney disease: Secondary | ICD-10-CM | POA: Diagnosis not present

## 2023-04-04 DIAGNOSIS — E1122 Type 2 diabetes mellitus with diabetic chronic kidney disease: Secondary | ICD-10-CM | POA: Diagnosis present

## 2023-04-04 DIAGNOSIS — I5022 Chronic systolic (congestive) heart failure: Secondary | ICD-10-CM | POA: Diagnosis present

## 2023-04-04 DIAGNOSIS — N1832 Chronic kidney disease, stage 3b: Secondary | ICD-10-CM | POA: Diagnosis present

## 2023-04-04 DIAGNOSIS — Z955 Presence of coronary angioplasty implant and graft: Secondary | ICD-10-CM

## 2023-04-04 DIAGNOSIS — E782 Mixed hyperlipidemia: Secondary | ICD-10-CM | POA: Diagnosis present

## 2023-04-04 DIAGNOSIS — R570 Cardiogenic shock: Secondary | ICD-10-CM | POA: Diagnosis present

## 2023-04-04 DIAGNOSIS — Z885 Allergy status to narcotic agent status: Secondary | ICD-10-CM | POA: Diagnosis not present

## 2023-04-04 DIAGNOSIS — I25110 Atherosclerotic heart disease of native coronary artery with unstable angina pectoris: Secondary | ICD-10-CM | POA: Diagnosis not present

## 2023-04-04 DIAGNOSIS — I214 Non-ST elevation (NSTEMI) myocardial infarction: Principal | ICD-10-CM | POA: Diagnosis present

## 2023-04-04 DIAGNOSIS — I951 Orthostatic hypotension: Secondary | ICD-10-CM | POA: Diagnosis present

## 2023-04-04 DIAGNOSIS — Z79899 Other long term (current) drug therapy: Secondary | ICD-10-CM

## 2023-04-04 DIAGNOSIS — I2722 Pulmonary hypertension due to left heart disease: Secondary | ICD-10-CM | POA: Diagnosis present

## 2023-04-04 DIAGNOSIS — I255 Ischemic cardiomyopathy: Secondary | ICD-10-CM | POA: Diagnosis present

## 2023-04-04 DIAGNOSIS — Z905 Acquired absence of kidney: Secondary | ICD-10-CM

## 2023-04-04 DIAGNOSIS — Z7984 Long term (current) use of oral hypoglycemic drugs: Secondary | ICD-10-CM | POA: Diagnosis not present

## 2023-04-04 DIAGNOSIS — Z7985 Long-term (current) use of injectable non-insulin antidiabetic drugs: Secondary | ICD-10-CM

## 2023-04-04 DIAGNOSIS — I361 Nonrheumatic tricuspid (valve) insufficiency: Secondary | ICD-10-CM | POA: Diagnosis not present

## 2023-04-04 DIAGNOSIS — Z01810 Encounter for preprocedural cardiovascular examination: Secondary | ICD-10-CM | POA: Diagnosis not present

## 2023-04-04 DIAGNOSIS — I351 Nonrheumatic aortic (valve) insufficiency: Secondary | ICD-10-CM | POA: Diagnosis not present

## 2023-04-04 DIAGNOSIS — Z951 Presence of aortocoronary bypass graft: Secondary | ICD-10-CM

## 2023-04-04 DIAGNOSIS — Z7982 Long term (current) use of aspirin: Secondary | ICD-10-CM

## 2023-04-04 DIAGNOSIS — I213 ST elevation (STEMI) myocardial infarction of unspecified site: Principal | ICD-10-CM

## 2023-04-04 DIAGNOSIS — E1142 Type 2 diabetes mellitus with diabetic polyneuropathy: Secondary | ICD-10-CM | POA: Diagnosis present

## 2023-04-04 DIAGNOSIS — I5023 Acute on chronic systolic (congestive) heart failure: Secondary | ICD-10-CM | POA: Diagnosis not present

## 2023-04-04 DIAGNOSIS — I340 Nonrheumatic mitral (valve) insufficiency: Secondary | ICD-10-CM | POA: Diagnosis not present

## 2023-04-04 DIAGNOSIS — R079 Chest pain, unspecified: Secondary | ICD-10-CM | POA: Diagnosis not present

## 2023-04-04 DIAGNOSIS — I5020 Unspecified systolic (congestive) heart failure: Secondary | ICD-10-CM

## 2023-04-04 HISTORY — DX: Essential (primary) hypertension: I10

## 2023-04-04 HISTORY — PX: RIGHT/LEFT HEART CATH AND CORONARY ANGIOGRAPHY: CATH118266

## 2023-04-04 LAB — COMPREHENSIVE METABOLIC PANEL
ALT: 13 U/L (ref 0–44)
ALT: 13 U/L (ref 0–44)
AST: 18 U/L (ref 15–41)
AST: 16 U/L (ref 15–41)
Albumin: 3.8 g/dL (ref 3.5–5.0)
Albumin: 3.4 g/dL — ABNORMAL LOW (ref 3.5–5.0)
Alkaline Phosphatase: 56 U/L (ref 38–126)
Alkaline Phosphatase: 55 U/L (ref 38–126)
Anion gap: 9 (ref 5–15)
Anion gap: 10 (ref 5–15)
BUN: 26 mg/dL — ABNORMAL HIGH (ref 8–23)
BUN: 24 mg/dL — ABNORMAL HIGH (ref 8–23)
CO2: 24 mmol/L (ref 22–32)
CO2: 22 mmol/L (ref 22–32)
Calcium: 9.6 mg/dL (ref 8.9–10.3)
Calcium: 9.4 mg/dL (ref 8.9–10.3)
Chloride: 101 mmol/L (ref 98–111)
Chloride: 101 mmol/L (ref 98–111)
Creatinine, Ser: 1.25 mg/dL — ABNORMAL HIGH (ref 0.44–1.00)
Creatinine, Ser: 1.16 mg/dL — ABNORMAL HIGH (ref 0.44–1.00)
GFR, Estimated: 47 mL/min — ABNORMAL LOW (ref >60)
GFR, Estimated: 51 mL/min — ABNORMAL LOW (ref >60)
Glucose, Bld: 169 mg/dL — ABNORMAL HIGH (ref 70–99)
Glucose, Bld: 191 mg/dL — ABNORMAL HIGH (ref 70–99)
Potassium: 4.2 mmol/L (ref 3.5–5.1)
Potassium: 3.9 mmol/L (ref 3.5–5.1)
Sodium: 134 mmol/L — ABNORMAL LOW (ref 135–145)
Sodium: 133 mmol/L — ABNORMAL LOW (ref 135–145)
Total Bilirubin: 0.7 mg/dL (ref 0.3–1.2)
Total Bilirubin: 0.6 mg/dL (ref 0.3–1.2)
Total Protein: 8.3 g/dL — ABNORMAL HIGH (ref 6.5–8.1)
Total Protein: 7.2 g/dL (ref 6.5–8.1)

## 2023-04-04 LAB — LIPID PANEL
Cholesterol: 191 mg/dL (ref 0–200)
Cholesterol: 169 mg/dL (ref 0–200)
HDL: 40 mg/dL — ABNORMAL LOW (ref >40)
HDL: 41 mg/dL (ref >40)
LDL Cholesterol: 112 mg/dL — ABNORMAL HIGH (ref 0–99)
LDL Cholesterol: 111 mg/dL — ABNORMAL HIGH (ref 0–99)
Total CHOL/HDL Ratio: 4.8 RATIO
Total CHOL/HDL Ratio: 4.1 RATIO
Triglycerides: 195 mg/dL — ABNORMAL HIGH (ref <150)
Triglycerides: 86 mg/dL (ref <150)
VLDL: 39 mg/dL (ref 0–40)
VLDL: 17 mg/dL (ref 0–40)

## 2023-04-04 LAB — CBC WITH DIFFERENTIAL/PLATELET
Abs Immature Granulocytes: 0.06 10*3/uL (ref 0.00–0.07)
Basophils Absolute: 0 10*3/uL (ref 0.0–0.1)
Basophils Relative: 1 %
Eosinophils Absolute: 0.1 10*3/uL (ref 0.0–0.5)
Eosinophils Relative: 1 %
HCT: 37.5 % (ref 36.0–46.0)
Hemoglobin: 12 g/dL (ref 12.0–15.0)
Immature Granulocytes: 1 %
Lymphocytes Relative: 53 %
Lymphs Abs: 4.1 10*3/uL — ABNORMAL HIGH (ref 0.7–4.0)
MCH: 27.1 pg (ref 26.0–34.0)
MCHC: 32 g/dL (ref 30.0–36.0)
MCV: 84.7 fL (ref 80.0–100.0)
Monocytes Absolute: 0.6 10*3/uL (ref 0.1–1.0)
Monocytes Relative: 8 %
Neutro Abs: 2.7 10*3/uL (ref 1.7–7.7)
Neutrophils Relative %: 36 %
Platelets: 267 10*3/uL (ref 150–400)
RBC: 4.43 MIL/uL (ref 3.87–5.11)
RDW: 13.2 % (ref 11.5–15.5)
WBC: 7.6 10*3/uL (ref 4.0–10.5)
nRBC: 0 % (ref 0.0–0.2)

## 2023-04-04 LAB — ECHOCARDIOGRAM COMPLETE
AR max vel: 2.53 cm2
AV Area VTI: 2.78 cm2
AV Area mean vel: 2.55 cm2
AV Mean grad: 2 mmHg
AV Peak grad: 4.2 mmHg
Ao pk vel: 1.03 m/s
Height: 67.5 in
S' Lateral: 2.1 cm
Weight: 2624 oz

## 2023-04-04 LAB — POCT I-STAT 7, (LYTES, BLD GAS, ICA,H+H)
Acid-base deficit: 2 mmol/L (ref 0.0–2.0)
Bicarbonate: 22.9 mmol/L (ref 20.0–28.0)
Calcium, Ion: 1.2 mmol/L (ref 1.15–1.40)
HCT: 33 % — ABNORMAL LOW (ref 36.0–46.0)
Hemoglobin: 11.2 g/dL — ABNORMAL LOW (ref 12.0–15.0)
O2 Saturation: 95 %
Potassium: 4.1 mmol/L (ref 3.5–5.1)
Sodium: 137 mmol/L (ref 135–145)
TCO2: 24 mmol/L (ref 22–32)
pCO2 arterial: 38.8 mmHg (ref 32–48)
pH, Arterial: 7.38 (ref 7.35–7.45)
pO2, Arterial: 80 mmHg — ABNORMAL LOW (ref 83–108)

## 2023-04-04 LAB — GLUCOSE, CAPILLARY
Glucose-Capillary: 135 mg/dL — ABNORMAL HIGH (ref 70–99)
Glucose-Capillary: 119 mg/dL — ABNORMAL HIGH (ref 70–99)
Glucose-Capillary: 131 mg/dL — ABNORMAL HIGH (ref 70–99)

## 2023-04-04 LAB — LACTIC ACID, PLASMA: Lactic Acid, Venous: 1.3 mmol/L (ref 0.5–1.9)

## 2023-04-04 LAB — PROTIME-INR
INR: 0.9 (ref 0.8–1.2)
INR: 1 (ref 0.8–1.2)
Prothrombin Time: 12.7 seconds (ref 11.4–15.2)
Prothrombin Time: 13.8 seconds (ref 11.4–15.2)

## 2023-04-04 LAB — POCT I-STAT EG7
Acid-base deficit: 1 mmol/L (ref 0.0–2.0)
Acid-base deficit: 1 mmol/L (ref 0.0–2.0)
Bicarbonate: 24.7 mmol/L (ref 20.0–28.0)
Bicarbonate: 25.3 mmol/L (ref 20.0–28.0)
Calcium, Ion: 1.24 mmol/L (ref 1.15–1.40)
Calcium, Ion: 1.25 mmol/L (ref 1.15–1.40)
HCT: 34 % — ABNORMAL LOW (ref 36.0–46.0)
HCT: 34 % — ABNORMAL LOW (ref 36.0–46.0)
Hemoglobin: 11.6 g/dL — ABNORMAL LOW (ref 12.0–15.0)
Hemoglobin: 11.6 g/dL — ABNORMAL LOW (ref 12.0–15.0)
O2 Saturation: 72 %
O2 Saturation: 73 %
Potassium: 4.2 mmol/L (ref 3.5–5.1)
Potassium: 4.2 mmol/L (ref 3.5–5.1)
Sodium: 136 mmol/L (ref 135–145)
Sodium: 136 mmol/L (ref 135–145)
TCO2: 26 mmol/L (ref 22–32)
TCO2: 27 mmol/L (ref 22–32)
pCO2, Ven: 44.4 mmHg (ref 44–60)
pCO2, Ven: 45.2 mmHg (ref 44–60)
pH, Ven: 7.354 (ref 7.25–7.43)
pH, Ven: 7.355 (ref 7.25–7.43)
pO2, Ven: 40 mmHg (ref 32–45)
pO2, Ven: 40 mmHg (ref 32–45)

## 2023-04-04 LAB — CBC
HCT: 33.2 % — ABNORMAL LOW (ref 36.0–46.0)
Hemoglobin: 10.8 g/dL — ABNORMAL LOW (ref 12.0–15.0)
MCH: 27 pg (ref 26.0–34.0)
MCHC: 32.5 g/dL (ref 30.0–36.0)
MCV: 83 fL (ref 80.0–100.0)
Platelets: 243 10*3/uL (ref 150–400)
RBC: 4 MIL/uL (ref 3.87–5.11)
RDW: 13.2 % (ref 11.5–15.5)
WBC: 6.6 10*3/uL (ref 4.0–10.5)
nRBC: 0 % (ref 0.0–0.2)

## 2023-04-04 LAB — TROPONIN I (HIGH SENSITIVITY)
Troponin I (High Sensitivity): 171 ng/L (ref <18)
Troponin I (High Sensitivity): 139 ng/L (ref <18)

## 2023-04-04 LAB — POCT I-STAT, CHEM 8
BUN: 23 mg/dL (ref 8–23)
Calcium, Ion: 1.29 mmol/L (ref 1.15–1.40)
Chloride: 103 mmol/L (ref 98–111)
Creatinine, Ser: 1.1 mg/dL — ABNORMAL HIGH (ref 0.44–1.00)
Glucose, Bld: 195 mg/dL — ABNORMAL HIGH (ref 70–99)
HCT: 34 % — ABNORMAL LOW (ref 36.0–46.0)
Hemoglobin: 11.6 g/dL — ABNORMAL LOW (ref 12.0–15.0)
Potassium: 4.1 mmol/L (ref 3.5–5.1)
Sodium: 136 mmol/L (ref 135–145)
TCO2: 26 mmol/L (ref 22–32)

## 2023-04-04 LAB — MRSA NEXT GEN BY PCR, NASAL: MRSA by PCR Next Gen: NOT DETECTED

## 2023-04-04 LAB — HEMOGLOBIN A1C
Hgb A1c MFr Bld: 6.9 % — ABNORMAL HIGH (ref 4.8–5.6)
Mean Plasma Glucose: 151.33 mg/dL

## 2023-04-04 LAB — POCT ACTIVATED CLOTTING TIME: Activated Clotting Time: 213 seconds

## 2023-04-04 LAB — APTT
aPTT: 31 seconds (ref 24–36)
aPTT: >200 seconds (ref 24–36)

## 2023-04-04 SURGERY — RIGHT/LEFT HEART CATH AND CORONARY ANGIOGRAPHY
Anesthesia: LOCAL

## 2023-04-04 MED ORDER — HEPARIN (PORCINE) 25000 UT/250ML-% IV SOLN: 900.0000 [IU]/h | INTRAVENOUS | Status: DC

## 2023-04-04 MED ORDER — NITROGLYCERIN IN D5W 200-5 MCG/ML-% IV SOLN
2.0000 ug/min | INTRAVENOUS | Status: DC
  Administered 2023-04-04: 5 ug/min via INTRAVENOUS
  Filled 2023-04-04: qty 250

## 2023-04-04 MED ORDER — ORAL CARE MOUTH RINSE: 15.0000 mL | OROMUCOSAL | Status: DC | PRN

## 2023-04-04 MED ORDER — SODIUM CHLORIDE 0.9% FLUSH
3.0000 mL | Freq: Two times a day (BID) | INTRAVENOUS | Status: DC
  Administered 2023-04-04 – 2023-04-10 (×9): 3 mL via INTRAVENOUS

## 2023-04-04 MED ORDER — LIDOCAINE HCL (PF) 1 % IJ SOLN
INTRAMUSCULAR | Status: DC | PRN
  Administered 2023-04-04: 2 mL
  Administered 2023-04-04: 5 mL

## 2023-04-04 MED ORDER — SPIRONOLACTONE 25 MG PO TABS
25.0000 mg | ORAL_TABLET | Freq: Every day | ORAL | Status: DC
  Administered 2023-04-04 – 2023-04-08 (×5): 25 mg via ORAL
  Filled 2023-04-04 (×5): qty 1

## 2023-04-04 MED ORDER — ASPIRIN 81 MG PO CHEW: 324.0000 mg | CHEWABLE_TABLET | Freq: Once | ORAL | Status: DC

## 2023-04-04 MED ORDER — HEPARIN (PORCINE) 25000 UT/250ML-% IV SOLN
900.0000 [IU]/h | INTRAVENOUS | Status: DC
  Administered 2023-04-04 – 2023-04-09 (×5): 900 [IU]/h via INTRAVENOUS
  Filled 2023-04-04 (×5): qty 250

## 2023-04-04 MED ORDER — HEPARIN SODIUM (PORCINE) 1000 UNIT/ML IJ SOLN
INTRAMUSCULAR | Status: DC | PRN
  Administered 2023-04-04: 2000 [IU] via INTRAVENOUS

## 2023-04-04 MED ORDER — ASPIRIN 81 MG PO TBEC
81.0000 mg | DELAYED_RELEASE_TABLET | Freq: Every day | ORAL | Status: DC
  Administered 2023-04-05 – 2023-04-10 (×6): 81 mg via ORAL
  Filled 2023-04-04 (×6): qty 1

## 2023-04-04 MED ORDER — INSULIN ASPART 100 UNIT/ML IJ SOLN: 0.0000 [IU] | Freq: Every day | INTRAMUSCULAR | Status: DC

## 2023-04-04 MED ORDER — SACUBITRIL-VALSARTAN 24-26 MG PO TABS
1.0000 | ORAL_TABLET | Freq: Two times a day (BID) | ORAL | Status: DC
  Administered 2023-04-04 (×2): 1 via ORAL
  Filled 2023-04-04 (×3): qty 1

## 2023-04-04 MED ORDER — SODIUM CHLORIDE 0.9 % IV SOLN: INTRAVENOUS | Status: DC

## 2023-04-04 MED ORDER — HYDRALAZINE HCL 20 MG/ML IJ SOLN: 10.0000 mg | INTRAMUSCULAR | Status: AC | PRN

## 2023-04-04 MED ORDER — INSULIN ASPART 100 UNIT/ML IJ SOLN
4.0000 [IU] | Freq: Three times a day (TID) | INTRAMUSCULAR | Status: DC
  Administered 2023-04-05 – 2023-04-10 (×6): 4 [IU] via SUBCUTANEOUS

## 2023-04-04 MED ORDER — ASPIRIN 81 MG PO CHEW
324.0000 mg | CHEWABLE_TABLET | Freq: Once | ORAL | Status: AC
  Administered 2023-04-04: 324 mg via ORAL
  Filled 2023-04-04: qty 4

## 2023-04-04 MED ORDER — SODIUM CHLORIDE 0.9% FLUSH: 3.0000 mL | INTRAVENOUS | Status: DC | PRN

## 2023-04-04 MED ORDER — ONDANSETRON HCL 4 MG/2ML IJ SOLN
4.0000 mg | Freq: Four times a day (QID) | INTRAMUSCULAR | Status: DC | PRN
  Administered 2023-04-04: 4 mg via INTRAVENOUS

## 2023-04-04 MED ORDER — HEPARIN SODIUM (PORCINE) 5000 UNIT/ML IJ SOLN
4000.0000 [IU] | Freq: Once | INTRAMUSCULAR | Status: AC
  Administered 2023-04-04: 4000 [IU] via INTRAVENOUS
  Filled 2023-04-04: qty 1

## 2023-04-04 MED ORDER — IOHEXOL 350 MG/ML SOLN
INTRAVENOUS | Status: DC | PRN
  Administered 2023-04-04: 60 mL

## 2023-04-04 MED ORDER — HEPARIN (PORCINE) IN NACL 1000-0.9 UT/500ML-% IV SOLN
INTRAVENOUS | Status: DC | PRN
  Administered 2023-04-04 (×3): 500 mL

## 2023-04-04 MED ORDER — LABETALOL HCL 5 MG/ML IV SOLN
10.0000 mg | INTRAVENOUS | Status: AC | PRN
  Filled 2023-04-04: qty 4

## 2023-04-04 MED ORDER — INSULIN ASPART 100 UNIT/ML IJ SOLN
0.0000 [IU] | Freq: Three times a day (TID) | INTRAMUSCULAR | Status: DC
  Administered 2023-04-04: 2 [IU] via SUBCUTANEOUS
  Administered 2023-04-05: 5 [IU] via SUBCUTANEOUS
  Administered 2023-04-05: 2 [IU] via SUBCUTANEOUS
  Administered 2023-04-06: 3 [IU] via SUBCUTANEOUS
  Administered 2023-04-06: 2 [IU] via SUBCUTANEOUS
  Administered 2023-04-07: 3 [IU] via SUBCUTANEOUS
  Administered 2023-04-08 – 2023-04-09 (×4): 2 [IU] via SUBCUTANEOUS
  Administered 2023-04-10: 3 [IU] via SUBCUTANEOUS
  Administered 2023-04-10: 2 [IU] via SUBCUTANEOUS

## 2023-04-04 MED ORDER — EMPAGLIFLOZIN 10 MG PO TABS
10.0000 mg | ORAL_TABLET | Freq: Every day | ORAL | Status: DC
  Administered 2023-04-04: 10 mg via ORAL
  Filled 2023-04-04: qty 1

## 2023-04-04 MED ORDER — NITROGLYCERIN 0.4 MG SL SUBL
0.4000 mg | SUBLINGUAL_TABLET | Freq: Once | SUBLINGUAL | Status: AC
  Administered 2023-04-04: 0.4 mg via SUBLINGUAL
  Filled 2023-04-04: qty 1

## 2023-04-04 MED ORDER — CHLORHEXIDINE GLUCONATE CLOTH 2 % EX PADS
6.0000 | MEDICATED_PAD | Freq: Every day | CUTANEOUS | Status: DC
  Administered 2023-04-04 – 2023-04-05 (×2): 6 via TOPICAL

## 2023-04-04 MED ORDER — FUROSEMIDE 10 MG/ML IJ SOLN
40.0000 mg | Freq: Every day | INTRAMUSCULAR | Status: DC
  Administered 2023-04-04 – 2023-04-07 (×4): 40 mg via INTRAVENOUS
  Filled 2023-04-04 (×4): qty 4

## 2023-04-04 MED ORDER — ACETAMINOPHEN 325 MG PO TABS: 650.0000 mg | ORAL_TABLET | ORAL | Status: DC | PRN

## 2023-04-04 MED ORDER — SODIUM CHLORIDE 0.9 % IV SOLN: 250.0000 mL | INTRAVENOUS | Status: DC | PRN

## 2023-04-04 MED ORDER — ROSUVASTATIN CALCIUM 20 MG PO TABS
20.0000 mg | ORAL_TABLET | Freq: Every day | ORAL | Status: DC
  Administered 2023-04-04 – 2023-04-10 (×6): 20 mg via ORAL
  Filled 2023-04-04 (×6): qty 1

## 2023-04-04 MED ORDER — ONDANSETRON HCL 4 MG/2ML IJ SOLN
INTRAMUSCULAR | Status: AC
  Filled 2023-04-04: qty 2

## 2023-04-04 MED ORDER — ACETAMINOPHEN 500 MG PO TABS
500.0000 mg | ORAL_TABLET | Freq: Four times a day (QID) | ORAL | Status: DC | PRN
  Administered 2023-04-04 – 2023-04-06 (×2): 500 mg via ORAL
  Filled 2023-04-04 (×2): qty 1

## 2023-04-04 SURGICAL SUPPLY — 17 items
CATH INFINITI 5 FR JL3.5 (CATHETERS) IMPLANT
CATH OPTITORQUE TIG 4.0 5F (CATHETERS) IMPLANT
CATH SWAN GANZ VIP 7.5F (CATHETERS) IMPLANT
CATH-GARD ARROW CATH SHIELD (MISCELLANEOUS) ×1
DEVICE RAD COMP TR BAND LRG (VASCULAR PRODUCTS) IMPLANT
ELECT DEFIB PAD ADLT CADENCE (PAD) IMPLANT
GLIDESHEATH SLEND A-KIT 6F 22G (SHEATH) IMPLANT
GUIDEWIRE INQWIRE 1.5J.035X260 (WIRE) IMPLANT
INQWIRE 1.5J .035X260CM (WIRE) ×1
KIT ENCORE 26 ADVANTAGE (KITS) IMPLANT
KIT HEART LEFT (KITS) ×1 IMPLANT
KIT MICROPUNCTURE NIT STIFF (SHEATH) IMPLANT
PACK CARDIAC CATHETERIZATION (CUSTOM PROCEDURE TRAY) ×1 IMPLANT
SHEATH PINNACLE 8F 10CM (SHEATH) IMPLANT
SHIELD CATHGARD ARROW (MISCELLANEOUS) IMPLANT
TRANSDUCER W/STOPCOCK (MISCELLANEOUS) ×1 IMPLANT
TUBING CIL FLEX 10 FLL-RA (TUBING) ×1 IMPLANT

## 2023-04-04 NOTE — ED Notes (Signed)
Pt EKG obtained at 0453. STEMI protocol initiated. Patient placed on cardiac monitor. 2 peripheral IVs obtained. 18G to LAC and 20G to RAC. Medicated per MAR. Pain improved from 10/10 midsternal pressure to 5/10 after one SL nitroglycerin. Report called to [facility] by charge RN. Patient placed in gown and all other clothing removed. Pt is AxO x4. Pt transported via EMS at 0527.

## 2023-04-04 NOTE — H&P (Addendum)
Patricia Mooney is an 67 y.o. female.   Chief Complaint: Chest pain HPI:   67 y.o. African-American female  with hypertension, hyperlipidemia, type 2 SM, s/p Rt nephrectomy, admitted with chest pain  Patient has noted off-and-on chest pain for 1 week.  Pain is present at rest, she is not on any significant exertion.  She tries to go to sleep, but gets woken up by pain.  Currently, chest pain is 5/10.  EKG shows diffuse ST-T abnormality, along with aVR elevation, concerning for left main or global ischemia.  She was thus transferred emergently from [facility] emergency room to [facility].  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 No pertinent family history.  Social History:  reports that she has never smoked. She has never used smokeless tobacco. She reports that she does not drink alcohol and does not use drugs.  Allergies:  Allergies  Allergen Reactions   Codeine Nausea Only    Review of Systems  Cardiovascular:  Positive for chest pain. Negative for dyspnea on exertion, leg swelling, palpitations and syncope.  Psychiatric/Behavioral:  The patient is nervous/anxious.      Blood pressure (!) 163/84, pulse (!) 109, temperature 97.8 F (36.6 C), resp. rate (!) 26, height 5' 7.5" (1.715 m), weight 74.4 kg, SpO2 100 %. Body mass index is 25.31 kg/m.   Physical Exam Vitals and nursing note reviewed.  Constitutional:      General: She is not in acute distress. Neck:     Vascular: No JVD.  Cardiovascular:     Rate and Rhythm: Normal rate and regular rhythm.     Heart sounds: Normal heart sounds. No murmur heard. Pulmonary:     Effort:  Pulmonary effort is normal.     Breath sounds: Normal breath sounds. No wheezing or rales.  Musculoskeletal:     Right lower leg: No edema.     Left lower leg: No edema.      (Not in a hospital admission)     Current Facility-Administered Medications:    0.9 %  sodium chloride infusion, , Intravenous, Continuous, Sanders, Lisa M, PA-C   heparin ADULT infusion 100 units/mL (25000 units/232mL), 900 Units/hr, Intravenous, Continuous, Maurice March, [facility]  Current Outpatient Medications:    cetirizine (ZYRTEC) 10 MG tablet, Take 10 mg by mouth daily., Disp: , Rfl:    cholecalciferol (VITAMIN D) 1000 units tablet, Take 1,000 Units by mouth daily., Disp: , Rfl:    clindamycin (CLEOCIN) 300 MG capsule, Take 1 capsule (300 mg total) by mouth 3 (three) times daily., Disp: 24 capsule, Rfl: 0   glipiZIDE (GLUCOTROL) 5 MG tablet, Take by mouth daily before breakfast., Disp: , Rfl:    Insulin Detemir (LEVEMIR) 100 UNIT/ML Pen, Inject 42 Units into the skin daily at 10 pm. , Disp: , Rfl:    metFORMIN (GLUCOPHAGE) 1000 MG tablet, Take 1 tablet (1,000 mg total) by mouth 2 (two) times daily with a meal. Restart on 10/10/2016, Disp: , Rfl:    methocarbamol (ROBAXIN) 500 MG tablet, Take 1 tablet (500 mg total) by mouth 2 (two) times daily.,  Disp: 20 tablet, Rfl: 0   simvastatin (ZOCOR) 20 MG tablet, Take 20 mg by mouth daily., Disp: , Rfl:    trimethoprim-polymyxin b (POLYTRIM) ophthalmic solution, Place 1 drop into the right eye every 4 (four) hours., Disp: 10 mL, Rfl: 0   vitamin B-12 (CYANOCOBALAMIN) 100 MCG tablet, Take 100 mcg by mouth daily., Disp: , Rfl:    Today's Vitals   04/04/23 0452 04/04/23 0453 04/04/23 0515 04/04/23 0527  BP:  (!) 166/95 (!) 163/84   Pulse:  (!) 105 (!) 109   Resp:  (!) 22 (!) 26   Temp:  97.8 F (36.6 C)    SpO2:  100% 100%   Weight: 74.4 kg     Height: 5' 7.5" (1.715 m)     PainSc: 10-Worst pain ever  10-Worst pain ever 5    Body mass index is 25.31  kg/m.     Lab Results: Reviewed and interpreted: Labs pending   Tests ordered:  Lab Orders         CBC with Differential         Protime-INR         APTT         Comprehensive metabolic panel         Lipid panel         CBC         Comprehensive metabolic panel         Lipid panel         Hemoglobin A1c         CBC         Protime-INR         APTT       Cardiac Studies:   EKG 04/04/2023: Sinus tachycardia, probably ischemia  Echocardiogram: Ordered   Imaging/tests reviewed and independently interpreted:  Assessment & Recommendations:  67 y.o. African-American female  with hypertension, hyperlipidemia, but does notice, chest pain  Chest pain: Ongoing 5/10 chest pain, with diffuse ischemic changes concerning for global ischemia.  Discussed urgent coronary angiography and intervention, patient agrees.    Elder Negus, MD Pager: [phone] Office: [phone]

## 2023-04-04 NOTE — Consult Note (Signed)
[address]       [place] [address]             [phone]      Cardiothoracic Surgery Consultation  Reason for Consult: Severe multivessel coronary disease Referring Physician: Dr. Truett Mainland  Patricia Mooney is an 67 y.o. female.  HPI:   The patient is a 67 year old woman with a history of hypertension, hyperlipidemia, type 2 diabetes, and prior right nephrectomy who reports no prior cardiac history but has noted about a 1 week history of recurrent substernal chest discomfort usually occurring at night.  This has caused her to have to sit up in bed at night to relieve the pain.  Electrocardiogram on presentation today showed diffuse ST-T abnormality and she was taken directly to the Cath Lab.  HS troponin I was 171.  Cardiac catheterization showed diffuse high-grade calcific stenosis throughout the proximal to mid LAD up to 90%.  The distal LAD was a large vessel with no significant disease.  Left circumflex was a large codominant vessel with 50% proximal and 90% mid and distal stenoses.  The OM1 was small with ostial 80% stenosis.  OM 2 was a medium caliber vessel and OM 3 was larger with 80% proximal and distal stenosis.  The RCA was small and codominant and occluded in the midportion with filling of the distal vessel from the left by collaterals.  LVEDP was elevated at 35 mmHg.  There was severe global hypokinesis with ejection fraction of less than 20%.  PA pressure was moderately elevated at 41/23 with a mean of 31.  Right atrial pressure was 6.  Wedge pressure was 19.  Cardiac index was 3.7.  She reports no prior cardiac history.  She has noted that her energy level has been decreased for a while but she had no chest discomfort until the past week.  She has occasional swelling in her ankles.  She underwent bilateral vein stripping's many years ago.  Past Medical History:  Diagnosis Date   Hypercholesteremia    Hypertension    Periorbital edema of right  eye admitted 10/07/2016   Type II diabetes mellitus (HCC)     Past Surgical History:  Procedure Laterality Date   ABDOMINAL HYSTERECTOMY  1980   CESAREAN SECTION  1975; 1978   NEPHRECTOMY Right 1980   "said I was born w/one that didn't work"    History reviewed. No pertinent family history.  Social History:  reports that she has never smoked. She has never used smokeless tobacco. She reports that she does not drink alcohol and does not use drugs.  Allergies:  Allergies  Allergen Reactions   Codeine Nausea Only    Medications: I have reviewed the patient's current medications. Prior to Admission:  Medications Prior to Admission  Medication Sig Dispense Refill Last Dose   acetaminophen (TYLENOL) 500 MG tablet Take 1,000 mg by mouth daily.   04/03/2023   ASPIRIN LOW DOSE 81 MG tablet Take 81 mg by mouth daily.   04/03/2023   cholecalciferol (VITAMIN D) 1000 units tablet Take 1,000 Units by mouth daily.   04/03/2023   diclofenac Sodium (VOLTAREN) 1 % GEL Apply 1 g topically 3 (three) times daily.   04/03/2023   glipiZIDE (GLUCOTROL) 5 MG tablet Take by mouth daily before breakfast.   04/03/2023   losartan (COZAAR) 25 MG tablet Take 25 mg by mouth 2 (two) times daily.   04/03/2023   metFORMIN (GLUCOPHAGE) 1000 MG tablet  Take 1 tablet (1,000 mg total) by mouth 2 (two) times daily with a meal. Restart on 10/10/2016   04/03/2023   OZEMPIC, 0.25 OR 0.5 MG/DOSE, 2 MG/3ML SOPN Inject 0.25 mg into the skin once a week. Patient uses on Sunday   Past Week   simvastatin (ZOCOR) 20 MG tablet Take 20 mg by mouth daily.   04/03/2023   vitamin B-12 (CYANOCOBALAMIN) 100 MCG tablet Take 100 mcg by mouth daily.   04/03/2023   Scheduled:  [START ON 04/05/2023] aspirin EC  81 mg Oral Daily   Chlorhexidine Gluconate Cloth  6 each Topical Daily   empagliflozin  10 mg Oral Daily   furosemide  40 mg Intravenous Daily   insulin aspart  0-15 Units Subcutaneous TID WC   insulin aspart  0-5 Units Subcutaneous QHS    insulin aspart  4 Units Subcutaneous TID WC   rosuvastatin  20 mg Oral Daily   sacubitril-valsartan  1 tablet Oral BID   sodium chloride flush  3 mL Intravenous Q12H   spironolactone  25 mg Oral Daily   Continuous:  sodium chloride 20 mL/hr at 04/04/23 1300   sodium chloride     heparin     nitroGLYCERIN 25 mcg/min (04/04/23 1300)   ZOX:WRUEAV chloride, acetaminophen, ondansetron (ZOFRAN) IV, mouth rinse, sodium chloride flush  Results for orders placed or performed during the hospital encounter of 04/04/23 (from the past 48 hour(s))  CBC with Differential     Status: Abnormal   Collection Time: 04/04/23  5:00 AM  Result Value Ref Range   WBC 7.6 4.0 - 10.5 K/uL   RBC 4.43 3.87 - 5.11 MIL/uL   Hemoglobin 12.0 12.0 - 15.0 g/dL   HCT 40.9 81.1 - 91.4 %   MCV 84.7 80.0 - 100.0 fL   MCH 27.1 26.0 - 34.0 pg   MCHC 32.0 30.0 - 36.0 g/dL   RDW 78.2 95.6 - 21.3 %   Platelets 267 150 - 400 K/uL   nRBC 0.0 0.0 - 0.2 %   Neutrophils Relative % 36 %   Neutro Abs 2.7 1.7 - 7.7 K/uL   Lymphocytes Relative 53 %   Lymphs Abs 4.1 (H) 0.7 - 4.0 K/uL   Monocytes Relative 8 %   Monocytes Absolute 0.6 0.1 - 1.0 K/uL   Eosinophils Relative 1 %   Eosinophils Absolute 0.1 0.0 - 0.5 K/uL   Basophils Relative 1 %   Basophils Absolute 0.0 0.0 - 0.1 K/uL   Immature Granulocytes 1 %   Abs Immature Granulocytes 0.06 0.00 - 0.07 K/uL    Comment: Performed at [facility], [address] [place], [place] [address]  Troponin I (High Sensitivity)     Status: Abnormal   Collection Time: 04/04/23  5:00 AM  Result Value Ref Range   Troponin I (High Sensitivity) 171 (HH) <18 ng/L    Comment: CRITICAL RESULT CALLED TO, READ BACK BY AND VERIFIED WITH Darlin Drop RN @ [phone] 04/04/23. GILBERTL (NOTE) Elevated high sensitivity troponin I (hsTnI) values and significant  changes across serial measurements may suggest ACS but many other  chronic and acute conditions are known to elevate hsTnI  results.  Refer to the "Links" section for chest pain algorithms and additional  guidance. Performed at [facility], [address] [place], [place] [address]   Protime-INR     Status: None   Collection Time: 04/04/23  5:00 AM  Result Value Ref Range   Prothrombin Time 12.7  11.4 - 15.2 seconds   INR 0.9 0.8 - 1.2    Comment: (NOTE) INR goal varies based on device and disease states. Performed at [facility], [address], [place], [place] [address]   APTT     Status: None   Collection Time: 04/04/23  5:00 AM  Result Value Ref Range   aPTT 31 24 - 36 seconds    Comment: Performed at [facility], [address], [place], [place] [address]  Comprehensive metabolic panel     Status: Abnormal   Collection Time: 04/04/23  5:03 AM  Result Value Ref Range   Sodium 134 (L) 135 - 145 mmol/L   Potassium 4.2 3.5 - 5.1 mmol/L   Chloride 101 98 - 111 mmol/L   CO2 24 22 - 32 mmol/L   Glucose, Bld 169 (H) 70 - 99 mg/dL    Comment: Glucose reference range applies only to samples taken after fasting for at least 8 hours.   BUN 26 (H) 8 - 23 mg/dL   Creatinine, Ser 0.98 (H) 0.44 - 1.00 mg/dL   Calcium 9.6 8.9 - 11.9 mg/dL   Total Protein 8.3 (H) 6.5 - 8.1 g/dL   Albumin 3.8 3.5 - 5.0 g/dL   AST 18 15 - 41 U/L   ALT 13 0 - 44 U/L   Alkaline Phosphatase 56 38 - 126 U/L   Total Bilirubin 0.7 0.3 - 1.2 mg/dL   GFR, Estimated 47 (L) >60 mL/min    Comment: (NOTE) Calculated using the CKD-EPI Creatinine Equation (2021)    Anion gap 9 5 - 15    Comment: Performed at [facility], [address] [place], [place] [address]  Lipid panel     Status: Abnormal   Collection Time: 04/04/23  5:03 AM  Result Value Ref Range   Cholesterol 191 0 - 200 mg/dL   Triglycerides 956 (H) <150 mg/dL   HDL 40 (L) >21 mg/dL   Total CHOL/HDL Ratio 4.8 RATIO   VLDL 39 0 - 40 mg/dL   LDL Cholesterol 308 (H) 0 - 99 mg/dL    Comment:         Total Cholesterol/HDL:CHD Risk Coronary Heart Disease Risk Table                     Men   Women  1/2 Average Risk   3.4   3.3  Average Risk       5.0   4.4  2 X Average Risk   9.6   7.1  3 X Average Risk  23.4   11.0        Use the calculated Patient Ratio above and the CHD Risk Table to determine the patient's CHD Risk.        ATP III CLASSIFICATION (LDL):  <100     mg/dL   Optimal  657-846  mg/dL   Near or Above                    Optimal  130-159  mg/dL   Borderline  962-952  mg/dL   High  >841     mg/dL   Very High Performed at [facility], [address] [place], [place] [address]   I-STAT, [place] 8     Status: Abnormal   Collection Time: 04/04/23  6:02 AM  Result Value Ref Range   Sodium 136 135 - 145 mmol/L   Potassium 4.1  3.5 - 5.1 mmol/L   Chloride 103 98 - 111 mmol/L   BUN 23 8 - 23 mg/dL   Creatinine, Ser 2.53 (H) 0.44 - 1.00 mg/dL   Glucose, Bld 664 (H) 70 - 99 mg/dL    Comment: Glucose reference range applies only to samples taken after fasting for at least 8 hours.   Calcium, Ion 1.29 1.15 - 1.40 mmol/L   TCO2 26 22 - 32 mmol/L   Hemoglobin 11.6 (L) 12.0 - 15.0 g/dL   HCT 40.3 (L) 47.4 - 25.9 %  Troponin I (High Sensitivity)     Status: Abnormal   Collection Time: 04/04/23  6:20 AM  Result Value Ref Range   Troponin I (High Sensitivity) 139 (HH) <18 ng/L    Comment: CRITICAL RESULT CALLED TO, READ BACK BY AND VERIFIED WITH K.HAYES RN @ 0730 04/04/23 BY C.EDENS (NOTE) Elevated high sensitivity troponin I (hsTnI) values and significant  changes across serial measurements may suggest ACS but many other  chronic and acute conditions are known to elevate hsTnI results.  Refer to the "Links" section for chest pain algorithms and additional  guidance. Performed at [facility], [address] [place], [place] [address]   CBC     Status: Abnormal   Collection Time: 04/04/23  6:20 AM  Result Value Ref Range   WBC 6.6 4.0 - 10.5  K/uL   RBC 4.00 3.87 - 5.11 MIL/uL   Hemoglobin 10.8 (L) 12.0 - 15.0 g/dL   HCT 56.4 (L) 33.2 - 95.1 %   MCV 83.0 80.0 - 100.0 fL   MCH 27.0 26.0 - 34.0 pg   MCHC 32.5 30.0 - 36.0 g/dL   RDW 88.4 16.6 - 06.3 %   Platelets 243 150 - 400 K/uL   nRBC 0.0 0.0 - 0.2 %    Comment: Performed at [facility], [address], [place], [place] [address]  Comprehensive metabolic panel     Status: Abnormal   Collection Time: 04/04/23  6:20 AM  Result Value Ref Range   Sodium 133 (L) 135 - 145 mmol/L   Potassium 3.9 3.5 - 5.1 mmol/L   Chloride 101 98 - 111 mmol/L   CO2 22 22 - 32 mmol/L   Glucose, Bld 191 (H) 70 - 99 mg/dL    Comment: Glucose reference range applies only to samples taken after fasting for at least 8 hours.   BUN 24 (H) 8 - 23 mg/dL   Creatinine, Ser 0.93 (H) 0.44 - 1.00 mg/dL   Calcium 9.4 8.9 - 23.5 mg/dL   Total Protein 7.2 6.5 - 8.1 g/dL   Albumin 3.4 (L) 3.5 - 5.0 g/dL   AST 16 15 - 41 U/L   ALT 13 0 - 44 U/L   Alkaline Phosphatase 55 38 - 126 U/L   Total Bilirubin 0.6 0.3 - 1.2 mg/dL   GFR, Estimated 51 (L) >60 mL/min    Comment: (NOTE) Calculated using the CKD-EPI Creatinine Equation (2021)    Anion gap 10 5 - 15    Comment: Performed at [facility], [address] [place], [place] [address]  Hemoglobin A1c     Status: Abnormal   Collection Time: 04/04/23  6:20 AM  Result Value Ref Range   Hgb A1c MFr Bld 6.9 (H) 4.8 - 5.6 %    Comment: (NOTE) Pre diabetes:          5.7%-6.4%  Diabetes:              >  6.4%  Glycemic control for   <7.0% adults with diabetes    Mean Plasma Glucose 151.33 mg/dL    Comment: Performed at [facility], [address] [place], [place] [address]  Lactic acid, plasma     Status: None   Collection Time: 04/04/23  6:20 AM  Result Value Ref Range   Lactic Acid, Venous 1.3 0.5 - 1.9 mmol/L    Comment: Performed at [facility], [address], [place], [place] [address]  Lipid panel     Status: Abnormal    Collection Time: 04/04/23  6:20 AM  Result Value Ref Range   Cholesterol 169 0 - 200 mg/dL   Triglycerides 86 <098 mg/dL   HDL 41 >11 mg/dL   Total CHOL/HDL Ratio 4.1 RATIO   VLDL 17 0 - 40 mg/dL   LDL Cholesterol 914 (H) 0 - 99 mg/dL    Comment:        Total Cholesterol/HDL:CHD Risk Coronary Heart Disease Risk Table                     Men   Women  1/2 Average Risk   3.4   3.3  Average Risk       5.0   4.4  2 X Average Risk   9.6   7.1  3 X Average Risk  23.4   11.0        Use the calculated Patient Ratio above and the CHD Risk Table to determine the patient's CHD Risk.        ATP III CLASSIFICATION (LDL):  <100     mg/dL   Optimal  782-956  mg/dL   Near or Above                    Optimal  130-159  mg/dL   Borderline  213-086  mg/dL   High  >578     mg/dL   Very High Performed at [facility], [address] [place], [place] [address]   Protime-INR     Status: None   Collection Time: 04/04/23  6:20 AM  Result Value Ref Range   Prothrombin Time 13.8 11.4 - 15.2 seconds   INR 1.0 0.8 - 1.2    Comment: (NOTE) INR goal varies based on device and disease states. Performed at [facility], [address], [place], [place] [address]   APTT     Status: Abnormal   Collection Time: 04/04/23  6:20 AM  Result Value Ref Range   aPTT >200 (HH) 24 - 36 seconds    Comment:        IF BASELINE aPTT IS ELEVATED, SUGGEST PATIENT RISK ASSESSMENT BE USED TO DETERMINE APPROPRIATE ANTICOAGULANT THERAPY. REPEATED TO VERIFY CRITICAL RESULT CALLED TO, READ BACK BY AND VERIFIED WITH: Neville Route, RN AT 04/04/2023 AT 1137AM BY SWEETSELL CUSTODIO Performed at [facility], [address], [place], [place] [address]   POCT I-Stat EG7     Status: Abnormal   Collection Time: 04/04/23  6:30 AM  Result Value Ref Range   pH, Ven 7.354 7.25 - 7.43   pCO2, Ven 44.4 44 - 60 mmHg   pO2, Ven 40 32 - 45 mmHg   Bicarbonate 24.7 20.0 - 28.0 mmol/L   TCO2 26 22 - 32 mmol/L   O2  Saturation 72 %   Acid-base deficit 1.0 0.0 - 2.0 mmol/L   Sodium 136 135 -  145 mmol/L   Potassium 4.2 3.5 - 5.1 mmol/L   Calcium, Ion 1.24 1.15 - 1.40 mmol/L   HCT 34.0 (L) 36.0 - 46.0 %   Hemoglobin 11.6 (L) 12.0 - 15.0 g/dL   Sample type VENOUS   I-STAT 7, (LYTES, BLD GAS, ICA, H+H)     Status: Abnormal   Collection Time: 04/04/23  6:30 AM  Result Value Ref Range   pH, Arterial 7.380 7.35 - 7.45   pCO2 arterial 38.8 32 - 48 mmHg   pO2, Arterial 80 (L) 83 - 108 mmHg   Bicarbonate 22.9 20.0 - 28.0 mmol/L   TCO2 24 22 - 32 mmol/L   O2 Saturation 95 %   Acid-base deficit 2.0 0.0 - 2.0 mmol/L   Sodium 137 135 - 145 mmol/L   Potassium 4.1 3.5 - 5.1 mmol/L   Calcium, Ion 1.20 1.15 - 1.40 mmol/L   HCT 33.0 (L) 36.0 - 46.0 %   Hemoglobin 11.2 (L) 12.0 - 15.0 g/dL   Sample type ARTERIAL   POCT Activated clotting time     Status: None   Collection Time: 04/04/23  6:34 AM  Result Value Ref Range   Activated Clotting Time 213 seconds    Comment: Reference range 74-137 seconds for patients not on anticoagulant therapy.  POCT I-Stat EG7     Status: Abnormal   Collection Time: 04/04/23  6:35 AM  Result Value Ref Range   pH, Ven 7.355 7.25 - 7.43   pCO2, Ven 45.2 44 - 60 mmHg   pO2, Ven 40 32 - 45 mmHg   Bicarbonate 25.3 20.0 - 28.0 mmol/L   TCO2 27 22 - 32 mmol/L   O2 Saturation 73 %   Acid-base deficit 1.0 0.0 - 2.0 mmol/L   Sodium 136 135 - 145 mmol/L   Potassium 4.2 3.5 - 5.1 mmol/L   Calcium, Ion 1.25 1.15 - 1.40 mmol/L   HCT 34.0 (L) 36.0 - 46.0 %   Hemoglobin 11.6 (L) 12.0 - 15.0 g/dL   Sample type VENOUS   MRSA Next Gen by PCR, Nasal     Status: None   Collection Time: 04/04/23  7:33 AM   Specimen: Nasal Mucosa; Nasal Swab  Result Value Ref Range   MRSA by PCR Next Gen NOT DETECTED NOT DETECTED    Comment: (NOTE) The GeneXpert MRSA Assay (FDA approved for NASAL specimens only), is one component of a comprehensive MRSA colonization surveillance program. It is not  intended to diagnose MRSA infection nor to guide or monitor treatment for MRSA infections. Test performance is not FDA approved in patients less than 17 years old. Performed at [facility], [address], [place], [place] [address]   Glucose, capillary     Status: Abnormal   Collection Time: 04/04/23 12:23 PM  Result Value Ref Range   Glucose-Capillary 135 (H) 70 - 99 mg/dL    Comment: Glucose reference range applies only to samples taken after fasting for at least 8 hours.  Glucose, capillary     Status: Abnormal   Collection Time: 04/04/23  4:12 PM  Result Value Ref Range   Glucose-Capillary 119 (H) 70 - 99 mg/dL    Comment: Glucose reference range applies only to samples taken after fasting for at least 8 hours.    ECHOCARDIOGRAM COMPLETE  Result Date: 04/04/2023    ECHOCARDIOGRAM REPORT   Patient Name:   Patricia Mooney Date of Exam: 04/04/2023 Medical Rec #:  [id number]  Height:       67.5 in Accession #:    [id number]     Weight:       164.0 lb Date of Birth:  08-06-1956      BSA:          1.869 m Patient Age:    67 years       BP:           158/75 mmHg Patient Gender: F              HR:           81 bpm. Exam Location:  Inpatient Procedure: 2D Echo, Color Doppler and Cardiac Doppler Indications:     NSTEMI  History:         Patient has no prior history of Echocardiogram examinations.                  CHF, NSTEMI; Risk Factors:Hypertension, Dyslipidemia and                  Diabetes.  Sonographer:     Milbert Coulter Referring Phys:  [id number] Anabel Bene PATWARDHAN Diagnosing Phys: Truett Mainland MD IMPRESSIONS  1. Severe hypokinesis of anterior, anteroseptalm apical wall. Mild hypokinesis of basal-mid inferolateral wall. . Left ventricular ejection fraction, by estimation, is 25 to 30%. The left ventricle has severely decreased function. The left ventricle demonstrates regional wall motion abnormalities (see scoring diagram/findings for description). There is mild left ventricular  hypertrophy. Left ventricular diastolic parameters are consistent with Grade I diastolic dysfunction (impaired relaxation). There is severe hypokinesis of the left ventricular, entire anterior wall, apical segment and anteroseptal wall. There is mild hypokinesis of the left ventricular, basal-mid inferolateral wall.  2. Right ventricular systolic function is normal. The right ventricular size is normal.  3. The mitral valve is normal in structure. No evidence of mitral valve regurgitation. No evidence of mitral stenosis.  4. The aortic valve is normal in structure. Aortic valve regurgitation is not visualized. No aortic stenosis is present. FINDINGS  Left Ventricle: Severe hypokinesis of anterior, anteroseptalm apical wall. Mild hypokinesis of basal-mid inferolateral wall. Left ventricular ejection fraction, by estimation, is 25 to 30%. The left ventricle has severely decreased function. The left ventricle demonstrates regional wall motion abnormalities. Severe hypokinesis of the left ventricular, entire anterior wall, apical segment and anteroseptal wall. Mild hypokinesis of the left ventricular, basal-mid inferolateral wall. The left ventricular internal cavity size was normal in size. There is mild left ventricular hypertrophy. Left ventricular diastolic parameters are consistent with Grade I diastolic dysfunction (impaired relaxation). Normal left ventricular filling pressure. Right Ventricle: The right ventricular size is normal. No increase in right ventricular wall thickness. Right ventricular systolic function is normal. Left Atrium: Left atrial size was normal in size. Right Atrium: Right atrial size was normal in size. Pericardium: There is no evidence of pericardial effusion. Mitral Valve: The mitral valve is normal in structure. No evidence of mitral valve regurgitation. No evidence of mitral valve stenosis. Tricuspid Valve: The tricuspid valve is normal in structure. Tricuspid valve regurgitation is not  demonstrated. No evidence of tricuspid stenosis. Aortic Valve: The aortic valve is normal in structure. Aortic valve regurgitation is not visualized. No aortic stenosis is present. Aortic valve mean gradient measures 2.0 mmHg. Aortic valve peak gradient measures 4.2 mmHg. Aortic valve area, by VTI measures 2.78 cm. Pulmonic Valve: The pulmonic valve was normal in structure. Pulmonic valve regurgitation is not visualized. No evidence of pulmonic stenosis.  Aorta: The aortic root is normal in size and structure. IAS/Shunts: The interatrial septum was not assessed.  LEFT VENTRICLE PLAX 2D LVIDd:         3.50 cm   Diastology LVIDs:         2.10 cm   LV e' medial:  6.71 cm/s LV PW:         1.00 cm   LV e' lateral: 7.09 cm/s LV IVS:        1.00 cm LVOT diam:     2.00 cm LV SV:         43 LV SV Index:   23 LVOT Area:     3.14 cm  RIGHT VENTRICLE RV Basal diam:  2.20 cm RV Mid diam:    2.20 cm RV S prime:     19.80 cm/s TAPSE (M-mode): 2.0 cm LEFT ATRIUM             Index        RIGHT ATRIUM          Index LA diam:        2.50 cm 1.34 cm/m   RA Area:     8.93 cm LA Vol (A2C):   29.9 ml 16.00 ml/m  RA Volume:   15.20 ml 8.13 ml/m LA Vol (A4C):   24.8 ml 13.27 ml/m LA Biplane Vol: 29.4 ml 15.73 ml/m  AORTIC VALVE AV Area (Vmax):    2.53 cm AV Area (Vmean):   2.55 cm AV Area (VTI):     2.78 cm AV Vmax:           103.00 cm/s AV Vmean:          69.000 cm/s AV VTI:            0.155 m AV Peak Grad:      4.2 mmHg AV Mean Grad:      2.0 mmHg LVOT Vmax:         82.80 cm/s LVOT Vmean:        56.100 cm/s LVOT VTI:          0.137 m LVOT/AV VTI ratio: 0.88  SHUNTS Systemic VTI:  0.14 m Systemic Diam: 2.00 cm Truett Mainland MD Electronically signed by Truett Mainland MD Signature Date/Time: 04/04/2023/5:15:54 PM    Final    CARDIAC CATHETERIZATION  Result Date: 04/04/2023 Images from the original result were not included. LM: No distinct left main appreciable with apparent dual ostial to LAD and Lcx LAD: Prox 80%,  followed by diffuse mid 90% stenoses Lcx: Large, codominant        Prox 50%, mid and distal 90% stenoses        Small OM1 with ostial 80% stenosis        Lateral branch of OM3 with prox 80% stenosis RCA: Small, codominant         Prox 90% stenosis, followed by mid occlusion, chronic         Left-to-right collaterals from LAD septals to distal RCA LVEDP 35 mmHg Severe global hypokinesis, LVEF <20% RHC was performed given severely reduced LVEF and tachycardia RA: 6 mmHg RV: 40/6 mmHg PA: 41/23 mmHg, mPAP 31 mmHg PCW: 19 mmHg CO: 6.9 L/min CI: 3.7 L/min/m2 Conclusion: Severe multivessel CAD No one culprit vessel amenable to PCI Severely reduced LVEF, likely ischemic cardiomyopathy Decompensation with cardiogenic shock Mild PH, WHO Grp II Recommendation: Admit to ICU CVTS consult for CABG If patient remains stable. Could be moved to progressive unit later  todayElder Negus, MD Pager: [phone] Office: [phone]  DG Chest Port 1 View  Result Date: 04/04/2023 CLINICAL DATA:  67 year old female with history of chest pain. EXAM: PORTABLE CHEST 1 VIEW COMPARISON:  Chest x-ray 02/26/2008. FINDINGS: Lung volumes are normal. No consolidative airspace disease. No pleural effusions. Diffuse interstitial prominence and widespread peribronchial cuffing. No evidence of pulmonary edema. Heart size is mildly enlarged with prominence of the left ventricular contour. Upper mediastinal contours are within normal limits. IMPRESSION: 1. Diffuse interstitial prominence and peribronchial cuffing, concerning for probable bronchitis. 2. Cardiomegaly with prominence of the left ventricular contour, suggestive of left ventricular hypertrophy. Electronically Signed   By: Trudie Reed M.D.   On: 04/04/2023 06:03    Review of Systems  Constitutional:  Positive for activity change and fatigue.  HENT: Negative.    Eyes: Negative.   Respiratory:  Positive for shortness of breath.   Cardiovascular:  Positive for chest pain  and leg swelling.  Gastrointestinal: Negative.   Endocrine: Negative.   Genitourinary: Negative.   Musculoskeletal: Negative.   Allergic/Immunologic: Negative.   Neurological:  Negative for dizziness and syncope.  Hematological: Negative.   Psychiatric/Behavioral: Negative.     Blood pressure 135/85, pulse 98, temperature 98.7 F (37.1 C), temperature source Oral, resp. rate (!) 21, height 5' 7.5" (1.715 m), weight 74.4 kg, SpO2 94 %. Physical Exam Constitutional:      Appearance: Normal appearance. She is normal weight.  HENT:     Head: Normocephalic and atraumatic.  Eyes:     Extraocular Movements: Extraocular movements intact.     Pupils: Pupils are equal, round, and reactive to light.  Neck:     Vascular: No carotid bruit.  Cardiovascular:     Rate and Rhythm: Normal rate and regular rhythm.     Pulses: Normal pulses.     Heart sounds: Normal heart sounds. No murmur heard.    Comments: Multiple small incisions in both legs from prior vein stripping.  Strong palpable left radial pulse. Pulmonary:     Effort: Pulmonary effort is normal.     Breath sounds: Normal breath sounds.  Abdominal:     General: There is no distension.     Tenderness: There is no abdominal tenderness.  Musculoskeletal:        General: No swelling.  Skin:    General: Skin is warm and dry.  Neurological:     General: No focal deficit present.     Mental Status: She is alert and oriented to person, place, and time.  Psychiatric:        Mood and Affect: Mood normal.        Behavior: Behavior normal.   Physicians  Panel Physicians Referring Physician Case Authorizing Physician  Patwardhan, Anabel Bene, MD (Primary)     Procedures  RIGHT/LEFT HEART CATH AND CORONARY ANGIOGRAPHY   Conclusion  LM: No distinct left main appreciable with apparent dual ostial to LAD and Lcx LAD: Prox 80%, followed by diffuse mid 90% stenoses Lcx: Large, codominant        Prox 50%, mid and distal 90% stenoses         Small OM1 with ostial 80% stenosis        Lateral branch of OM3 with prox 80% stenosis RCA: Small, codominant         Prox 90% stenosis, followed by mid occlusion, chronic         Left-to-right collaterals from LAD septals to distal RCA   LVEDP 35  mmHg Severe global hypokinesis, LVEF <20%   RHC was performed given severely reduced LVEF and tachycardia   RA: 6 mmHg RV: 40/6 mmHg PA: 41/23 mmHg, mPAP 31 mmHg PCW: 19 mmHg   CO: 6.9 L/min CI: 3.7 L/min/m2   Conclusion: Severe multivessel CAD No one culprit vessel amenable to PCI Severely reduced LVEF, likely ischemic cardiomyopathy Decompensation with cardiogenic shock Mild PH, WHO Grp II   Recommendation: Admit to ICU CVTS consult for CABG   If patient remains stable. Could be moved to progressive unit later today.    Elder Negus, MD Pager: [phone] Office: [phone]     Recommendations  Antiplatelet/Anticoag Recommend Aspirin 81mg  daily for moderate CAD. CVTS consult for CABG   Indications  Non-ST elevation (NSTEMI) myocardial infarction (HCC) [I21.4 (ICD-10-CM)]   Clinical Presentation  CHF/Shock Congestive heart failure present. NYHA Class III. No shock present.   Procedural Details  Technical Details Procedures: 1. Ultrasound guided Rt internal jugular venous access 2. Right heart catheterization 3. Left heart catheterization 4. Selective left and right coronary angiography  Indication: NSTEMI  History: 67 y.o. African-American female with hypertension, hyperlipidemia, type 2 SM, s/p Rt nephrectomy, admitted with chest pain, EKG concerning for global ischemia    Diagnostic Angiography: Catheter/s advances over guidewire under fluoroscopy Left coronary artery: 5 Fr JL 3.5/5 Fr TIG  Right coronary artery: 5 Fr TIG Left heart catheterization: 5 Ft TIG   Pressures tracings obtained in right atrium, right ventricle, pulmonary artery, and pulmonary capillary wedge  position. Invasive hemodynamic measurements performed using Fick method.     Anticoagulation:  Heparin 2000 U in cath lab, 4000 U prior to arrival  Hemostasis: TR band, manual pressure  Total contrast used: 60 cc   Total fluoro time: 3.4 min Air Kerma: 193 mGy  All wires and catheters removed out of the body at the end of the procedure Final angiogram showed no dissection/perforation.   Elder Negus, MD Pager: [phone] Office: [phone]            Estimated blood loss <50 mL.   During this procedure no sedation was administered.   Medications (Filter: Administrations occurring from 0550 to 0652 on 04/04/23)  important  Continuous medications are totaled by the amount administered until 04/04/23 0652.   Heparin (Porcine) in NaCl 1000-0.9 UT/500ML-% SOLN (mL)  Total volume: 1,500 mL Date/Time Rate/Dose/Volume Action   04/04/23 0603 500 mL Given   0603 500 mL Given   0604 500 mL Given   heparin sodium (porcine) injection (Units)  Total dose: 2,000 Units Date/Time Rate/Dose/Volume Action   04/04/23 0603 2,000 Units Given   iohexol (OMNIPAQUE) 350 MG/ML injection (mL)  Total volume: 60 mL Date/Time Rate/Dose/Volume Action   04/04/23 0639 60 mL Given   lidocaine (PF) (XYLOCAINE) 1 % injection (mL)  Total volume: 7 mL Date/Time Rate/Dose/Volume Action   04/04/23 0559 2 mL Given   0620 5 mL Given    Contrast     Administrations occurring from 0550 to 0652 on 04/04/23:  Medication Name Total Dose  iohexol (OMNIPAQUE) 350 MG/ML injection 60 mL   Radiation/Fluoro  Fluoro time: 3.4 (min) DAP: 12447 (mGycm2) Cumulative Air Kerma: 193 (mGy) Complications  Complications documented before study signed (04/04/2023  7:11 AM)   No complications were associated with this study.  Documented by Mila Palmer, RN - 04/04/2023  7:03 AM     Coronary Findings  Diagnostic Dominance: Co-dominant Left Anterior Descending  Prox LAD lesion is 80%  stenosed.  Mid LAD lesion is 90% stenosed.    Left Circumflex  Prox Cx lesion is 40% stenosed.  Mid Cx lesion is 90% stenosed.  Dist Cx lesion is 80% stenosed.    First Obtuse Marginal Branch  1st Mrg lesion is 80% stenosed.    Lateral Third Obtuse Marginal Branch  Lat 3rd Mrg lesion is 80% stenosed.    Right Coronary Artery  Vessel is small.  Collaterals  Dist RCA filled by collaterals from 2nd Sept.    Prox RCA lesion is 90% stenosed.  Mid RCA lesion is 100% stenosed.    Intervention   No interventions have been documented.   Right Heart  Right Heart Pressures RA: 6 mmHg RV: 40/6 mmHg PA: 41/23 mmHg, mPAP 31 mmHg PCW: 19 mmHg  CO: 6.9 L/min CI: 3.7 L/min/m2   Wall Motion     The following segments are hypokinetic: mid anterior, mid inferior, basilar anterior, basilar inferior, apical anterior and apical inferior.           Left Heart  Left Ventricle There is severe left ventricular systolic dysfunction. LV end diastolic pressure is moderately elevated. The left ventricular ejection fraction is less than 25% by visual estimate. There are LV function abnormalities due to global hypokinesis.   Coronary Diagrams  Diagnostic Dominance: Co-dominant  Intervention   Implants   No implant documentation for this case.   Syngo Images   Show images for CARDIAC CATHETERIZATION Images on Long Term Storage   Show images for [place], [place] to Procedure Log  Procedure Log    Hemo Data  Flowsheet Row Most Recent Value  Fick Cardiac Output 6.96 L/min  Fick Cardiac Output Index 3.7 (L/min)/BSA  RA A Wave 8 mmHg  RA V Wave 8 mmHg  RA Mean 6 mmHg  RV Systolic Pressure 40 mmHg  RV Diastolic Pressure 6 mmHg  RV EDP 8 mmHg  PA Systolic Pressure 41 mmHg  PA Diastolic Pressure 23 mmHg  PA Mean 31 mmHg  PW A Wave 25 mmHg  PW V Wave 26 mmHg  PW Mean 19 mmHg  AO Systolic Pressure 134 mmHg  AO Diastolic Pressure 75 mmHg  AO Mean 103 mmHg  LV Systolic  Pressure 148 mmHg  LV Diastolic Pressure 9 mmHg  LV EDP 26 mmHg  AOp Systolic Pressure 149 mmHg  AOp Diastolic Pressure 82 mmHg  AOp Mean Pressure 114 mmHg  LVp Systolic Pressure 151 mmHg  LVp Diastolic Pressure 19 mmHg  LVp EDP Pressure 33 mmHg  QP/QS 1  TPVR Index 8.37 HRUI  TSVR Index 29.42 HRUI  PVR SVR Ratio 0.12  TPVR/TSVR Ratio 0.28    ECHOCARDIOGRAM REPORT       Patient Name:   LARHONDA SERVELLO Date of Exam: 04/04/2023  Medical Rec #:  [id number]      Height:       67.5 in  Accession #:    [id number]     Weight:       164.0 lb  Date of Birth:  10-13-1956      BSA:          1.869 m  Patient Age:    67 years       BP:           158/75 mmHg  Patient Gender: F              HR:           81 bpm.  Exam Location:  Inpatient  Procedure: 2D Echo, Color Doppler and Cardiac Doppler   Indications:     NSTEMI    History:         Patient has no prior history of Echocardiogram  examinations.                  CHF, NSTEMI; Risk Factors:Hypertension, Dyslipidemia and                   Diabetes.    Sonographer:     Milbert Coulter  Referring Phys:  [id number] Anabel Bene PATWARDHAN  Diagnosing Phys: Truett Mainland MD   IMPRESSIONS     1. Severe hypokinesis of anterior, anteroseptalm apical wall. Mild  hypokinesis of basal-mid inferolateral wall. . Left ventricular ejection  fraction, by estimation, is 25 to 30%. The left ventricle has severely  decreased function. The left ventricle  demonstrates regional wall motion abnormalities (see scoring  diagram/findings for description). There is mild left ventricular  hypertrophy. Left ventricular diastolic parameters are consistent with  Grade I diastolic dysfunction (impaired relaxation).  There is severe hypokinesis of the left ventricular, entire anterior wall,  apical segment and anteroseptal wall. There is mild hypokinesis of the  left ventricular, basal-mid inferolateral wall.   2. Right ventricular systolic function is normal. The  right ventricular  size is normal.   3. The mitral valve is normal in structure. No evidence of mitral valve  regurgitation. No evidence of mitral stenosis.   4. The aortic valve is normal in structure. Aortic valve regurgitation is  not visualized. No aortic stenosis is present.   FINDINGS   Left Ventricle: Severe hypokinesis of anterior, anteroseptalm apical  wall. Mild hypokinesis of basal-mid inferolateral wall. Left ventricular  ejection fraction, by estimation, is 25 to 30%. The left ventricle has  severely decreased function. The left  ventricle demonstrates regional wall motion abnormalities. Severe  hypokinesis of the left ventricular, entire anterior wall, apical segment  and anteroseptal wall. Mild hypokinesis of the left ventricular, basal-mid  inferolateral wall. The left  ventricular internal cavity size was normal in size. There is mild left  ventricular hypertrophy. Left ventricular diastolic parameters are  consistent with Grade I diastolic dysfunction (impaired relaxation).  Normal left ventricular filling pressure.   Right Ventricle: The right ventricular size is normal. No increase in  right ventricular wall thickness. Right ventricular systolic function is  normal.   Left Atrium: Left atrial size was normal in size.   Right Atrium: Right atrial size was normal in size.   Pericardium: There is no evidence of pericardial effusion.   Mitral Valve: The mitral valve is normal in structure. No evidence of  mitral valve regurgitation. No evidence of mitral valve stenosis.   Tricuspid Valve: The tricuspid valve is normal in structure. Tricuspid  valve regurgitation is not demonstrated. No evidence of tricuspid  stenosis.   Aortic Valve: The aortic valve is normal in structure. Aortic valve  regurgitation is not visualized. No aortic stenosis is present. Aortic  valve mean gradient measures 2.0 mmHg. Aortic valve peak gradient measures  4.2 mmHg. Aortic valve  area, by VTI  measures 2.78 cm.   Pulmonic Valve: The pulmonic valve was normal in structure. Pulmonic valve  regurgitation is not visualized. No evidence of pulmonic stenosis.   Aorta: The aortic root is normal in size and structure.   IAS/Shunts: The interatrial septum was not assessed.     LEFT VENTRICLE  PLAX 2D  LVIDd:  3.50 cm   Diastology  LVIDs:         2.10 cm   LV e' medial:  6.71 cm/s  LV PW:         1.00 cm   LV e' lateral: 7.09 cm/s  LV IVS:        1.00 cm  LVOT diam:     2.00 cm  LV SV:         43  LV SV Index:   23  LVOT Area:     3.14 cm     RIGHT VENTRICLE  RV Basal diam:  2.20 cm  RV Mid diam:    2.20 cm  RV S prime:     19.80 cm/s  TAPSE (M-mode): 2.0 cm   LEFT ATRIUM             Index        RIGHT ATRIUM          Index  LA diam:        2.50 cm 1.34 cm/m   RA Area:     8.93 cm  LA Vol (A2C):   29.9 ml 16.00 ml/m  RA Volume:   15.20 ml 8.13 ml/m  LA Vol (A4C):   24.8 ml 13.27 ml/m  LA Biplane Vol: 29.4 ml 15.73 ml/m   AORTIC VALVE  AV Area (Vmax):    2.53 cm  AV Area (Vmean):   2.55 cm  AV Area (VTI):     2.78 cm  AV Vmax:           103.00 cm/s  AV Vmean:          69.000 cm/s  AV VTI:            0.155 m  AV Peak Grad:      4.2 mmHg  AV Mean Grad:      2.0 mmHg  LVOT Vmax:         82.80 cm/s  LVOT Vmean:        56.100 cm/s  LVOT VTI:          0.137 m  LVOT/AV VTI ratio: 0.88     SHUNTS  Systemic VTI:  0.14 m  Systemic Diam: 2.00 cm   Truett Mainland MD  Electronically signed by Truett Mainland MD  Signature Date/Time: 04/04/2023/5:15:54 PM        Final      Assessment/Plan:  This 67 year old woman has severe multivessel coronary disease with severe left ventricular systolic dysfunction with ejection fraction of 25 to 30% and no significant valvular abnormality.  I do not think her LAD is amenable to PCI given the degree of calcification and length of stenosis.  The distal vessel appears to be a large graftable  vessel.  The left circumflex system has significant stenoses and I think there is probably 2 graftable OM branches although there is some distal disease.  The RCA is occluded with faint filling of the distal vessel by collaterals from the left but I suspect this vessel is too small and diffusely diseased to graft.  She presented with ongoing angina and ruled in for NSTEMI.  She has continued to have some mild chest discomfort following IV nitroglycerin and heparin.  I think coronary bypass graft surgery is probably the best treatment for her although she requires some tuning given her markedly elevated LVEDP and severely decreased left ventricular systolic function.  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  She has also had vein stripping's in both legs and therefore she will need upper extremity arterial Dopplers to evaluate her left radial artery.  Ideally it would be best to diurese her and lower her LVEDP, aggressively treat her hypertension and hopefully get her pain-free and in better condition to undergo coronary bypass surgery.  We will order vein mapping and upper extremity arterial Dopplers on Monday with tentative plans for surgery by Dr. Cliffton Asters on Tuesday.  Alleen Borne 04/04/2023, 5:28 PM

## 2023-04-04 NOTE — ED Provider Notes (Signed)
[place] EMERGENCY DEPARTMENT AT [facility] Provider Note   CSN: [id number] Arrival date & time: 04/04/23  0441     History  Chief Complaint  Patient presents with   Chest Pain    Patricia Mooney is a 67 y.o. female.  With PMH of HTN, HLD, DM2 presenting with chest pain.  Patient says she has had chest pains on and off for the past week mainly at nighttime but also with rest during the day not exertional that is substernal nonradiating and pressure-like.  She is rating it 10 out of 10.  She has associated intermittent sweats and nausea but no vomiting.  She has had no fevers no chills no shortness of breath and no recent infections.  She has no history of heart attack or stroke.  She took 81 mg of aspirin.   Chest Pain      Home Medications Prior to Admission medications   Medication Sig Start Date End Date Taking? Authorizing Provider  cetirizine (ZYRTEC) 10 MG tablet Take 10 mg by mouth daily.    [provider]  cholecalciferol (VITAMIN D) 1000 units tablet Take 1,000 Units by mouth daily.    [provider]  clindamycin (CLEOCIN) 300 MG capsule Take 1 capsule (300 mg total) by mouth 3 (three) times daily. 10/09/16   Mikhail, Nita Sells, DO  glipiZIDE (GLUCOTROL) 5 MG tablet Take by mouth daily before breakfast.    [provider]  Insulin Detemir (LEVEMIR) 100 UNIT/ML Pen Inject 42 Units into the skin daily at 10 pm.     [provider]  metFORMIN (GLUCOPHAGE) 1000 MG tablet Take 1 tablet (1,000 mg total) by mouth 2 (two) times daily with a meal. Restart on 10/10/2016 10/09/16   Edsel Petrin, DO  methocarbamol (ROBAXIN) 500 MG tablet Take 1 tablet (500 mg total) by mouth 2 (two) times daily. 03/07/20   [name], NP  simvastatin (ZOCOR) 20 MG tablet Take 20 mg by mouth daily.    [provider]  trimethoprim-polymyxin b (POLYTRIM) ophthalmic solution Place 1 drop into the right eye every 4 (four) hours. 10/06/16    Deatra Canter, FNP  vitamin B-12 (CYANOCOBALAMIN) 100 MCG tablet Take 100 mcg by mouth daily.    [provider]      Allergies    Codeine    Review of Systems   Review of Systems  Cardiovascular:  Positive for chest pain.    Physical Exam Updated Vital Signs BP (!) 166/95   Pulse (!) 105   Temp 97.8 F (36.6 C)   Resp (!) 22   Ht 5' 7.5" (1.715 m)   Wt 74.4 kg   SpO2 100%   BMI 25.31 kg/m  Physical Exam Constitutional: Alert and oriented.  No acute distress and nontoxic. Eyes: Conjunctivae are normal. ENT      Head: Normocephalic and atraumatic. Cardiovascular: S1, S2, tachycardic, regular rhythm, normal and symmetric distal pulses are present in all extremities.Warm and well perfused. Respiratory: Normal respiratory effort. Breath sounds are normal.  O2 sat 100 on RA Gastrointestinal: Soft and nontender.  Musculoskeletal: Normal range of motion in all extremities. No pitting edema of lower extremities Neurologic: Normal speech and language.  Moving all 4 extremities equally.  Sensation grossly intact.  No gross focal neurologic deficits are appreciated. Skin: Skin is warm, dry and intact. No rash noted. Psychiatric: Mood and affect are normal. Speech and behavior are normal.  ED Results / Procedures / Treatments  Labs (all labs ordered are listed, but only abnormal results are displayed) Labs Reviewed  CBC WITH DIFFERENTIAL/PLATELET  PROTIME-INR  APTT  COMPREHENSIVE METABOLIC PANEL  LIPID PANEL  TROPONIN I (HIGH SENSITIVITY)    EKG EKG Interpretation  Date/Time:  Saturday April 04 2023 04:53:06 EDT Ventricular Rate:  101 PR Interval:  168 QRS Duration: 104 QT Interval:  330 QTC Calculation: 428 R Axis:   2 Text Interpretation: Sinus tachycardia Repol abnrm, severe global ischemia (LM/MVD) Concern for left main occlusion No previous ECGs available Confirmed by Vivien Rossetti ([id number]) on 04/04/2023 5:07:05 AM  Radiology No results  found.  Procedures .Critical Care  Performed by: Mardene Sayer, MD Authorized by: Mardene Sayer, MD   Critical care provider statement:    Critical care time (minutes):  35   Critical care was necessary to treat or prevent imminent or life-threatening deterioration of the following conditions:  Cardiac failure   Critical care was time spent personally by me on the following activities:  Development of treatment plan with patient or surrogate, discussions with consultants, evaluation of patient's response to treatment, examination of patient, ordering and review of laboratory studies, ordering and review of radiographic studies, ordering and performing treatments and interventions, pulse oximetry, re-evaluation of patient's condition, review of old charts and obtaining history from patient or surrogate   Care discussed with: accepting provider at another facility       Medications Ordered in ED Medications  0.9 %  sodium chloride infusion (has no administration in time range)  heparin ADULT infusion 100 units/mL (25000 units/229mL) (has no administration in time range)  aspirin chewable tablet 324 mg (324 mg Oral Given 04/04/23 0513)  nitroGLYCERIN (NITROSTAT) SL tablet 0.4 mg (0.4 mg Sublingual Given 04/04/23 0516)  heparin injection 4,000 Units (4,000 Units Intravenous Given 04/04/23 0519)    ED Course/ Medical Decision Making/ A&P                             Medical Decision Making [place] Patricia Mooney is a 67 y.o. female.  With PMH of HTN, HLD, DM2 presenting with chest pain.   EKG obtained on arrival with diffuse ischemic change with diffuse ST depressions and ST elevation in AVR.  Although patient nontoxic in appearance with her active chest pain and underlying risk factors, concern for possible left main occlusion.  She is nontoxic in appearance, no pulse deficits, no neurologic deficits, low suspicion for dissection.  Called code STEMI.  Spoke with Dr. Rosemary Holms on call  STEMI doctor who is in agreement.  Patient received full dose aspirin and IV heparin bolus in the ED.  She was emergently transferred to [facility] for intervention in Cath Lab.  Risk Prescription drug management.      Final Clinical Impression(s) / ED Diagnoses Final diagnoses:  ST elevation myocardial infarction (STEMI), unspecified artery [facility])    Rx / DC Orders ED Discharge Orders     None         Mardene Sayer, MD 04/04/23 [phone]

## 2023-04-04 NOTE — ED Triage Notes (Signed)
Patient reports chest pain for a little over a week, states worse at night. Endorses high BP at home 175/95 with elevated HR up to 114. Takes BP meds but patient states it still wasn't coming down so she decided to come in. Chest pain is central in location, 10/10.

## 2023-04-04 NOTE — Progress Notes (Signed)
ANTICOAGULATION CONSULT NOTE   Pharmacy Consult for heparin Indication: chest pain/ACS  Allergies  Allergen Reactions   Codeine Nausea Only    Patient Measurements: Height: 5' 7.5" (171.5 cm) Weight: 74.4 kg (164 lb) IBW/kg (Calculated) : 62.75 Heparin Dosing Weight: 74.4kg  Vital Signs: Temp: 98.2 F (36.8 C) (06/15 1215) Temp Source: Oral (06/15 1215) BP: 147/85 (06/15 1415) Pulse Rate: 92 (06/15 1415)  Labs: Recent Labs    04/04/23 0500 04/04/23 0503 04/04/23 0602 04/04/23 0620 04/04/23 0630 04/04/23 0635  HGB 12.0  --  11.6* 10.8* 11.2*  11.6* 11.6*  HCT 37.5  --  34.0* 33.2* 33.0*  34.0* 34.0*  PLT 267  --   --  243  --   --   APTT 31  --   --  >200*  --   --   LABPROT 12.7  --   --  13.8  --   --   INR 0.9  --   --  1.0  --   --   CREATININE  --  1.25* 1.10* 1.16*  --   --   TROPONINIHS 171*  --   --  139*  --   --     Estimated Creatinine Clearance: 45.4 mL/min (A) (by C-G formula based on SCr of 1.16 mg/dL (H)).   Medical History: Past Medical History:  Diagnosis Date   Hypercholesteremia    Hypertension    Periorbital edema of right eye admitted 10/07/2016   Type II diabetes mellitus [facility])    Assessment: 67 yo female reports chest pian for over a week. Endorses high BP at home.  Pharmacy consulted to dose heparin for ACS.  No prior AC noted  S/p cath lab this AM, awaiting TCTS consult.  Goal of Therapy:  Heparin level 0.3-0.7 units/ml Monitor platelets by anticoagulation protocol: Yes   Plan:  Discussed with Dr. Rosemary Holms, okay to resume IV Heparin now. Will start at 900 units/hr. Check heparin level 8 hrs after gtt resumed. Daily CBC, heparin level.  Reece Leader, Colon Flattery, BCCP Clinical Pharmacist  04/04/2023 3:33 PM   [facility] pharmacy phone numbers are listed on [email or web address]

## 2023-04-04 NOTE — Progress Notes (Signed)
ANTICOAGULATION CONSULT NOTE - Initial Consult  Pharmacy Consult for heparin Indication: chest pain/ACS  Allergies  Allergen Reactions   Codeine Nausea Only    Patient Measurements: Height: 5' 7.5" (171.5 cm) Weight: 74.4 kg (164 lb) IBW/kg (Calculated) : 62.75 Heparin Dosing Weight: 74.4kg  Vital Signs: Temp: 97.8 F (36.6 C) (06/15 0453) BP: 166/95 (06/15 0453) Pulse Rate: 105 (06/15 0453)  Labs: No results for input(s): "HGB", "HCT", "PLT", "APTT", "LABPROT", "INR", "HEPARINUNFRC", "HEPRLOWMOCWT", "CREATININE", "CKTOTAL", "CKMB", "TROPONINIHS" in the last 72 hours.  CrCl cannot be calculated (Patient's most recent lab result is older than the maximum 21 days allowed.).   Medical History: Past Medical History:  Diagnosis Date   Hypercholesteremia    Hypertension    Periorbital edema of right eye admitted 10/07/2016   Type II diabetes mellitus [facility])      Assessment: 67 yo female  reports chest pian for over a week. Endorses high BP at home.  Pharmacy consulted to dose heparin for ACS.  No prior AC noted  Labs in process  Goal of Therapy:  Heparin level 0.3-0.7 units/ml Monitor platelets by anticoagulation protocol: Yes   Plan:  Heparin 4000 units bolus x 1 Start heparin drip at 900 units/hr Heparin level in 6 hours Daily  CBC  Arley Phenix RPh 04/04/2023, 5:21 AM

## 2023-04-05 DIAGNOSIS — I25110 Atherosclerotic heart disease of native coronary artery with unstable angina pectoris: Secondary | ICD-10-CM | POA: Diagnosis not present

## 2023-04-05 DIAGNOSIS — I214 Non-ST elevation (NSTEMI) myocardial infarction: Secondary | ICD-10-CM | POA: Diagnosis not present

## 2023-04-05 LAB — CBC
HCT: 37 % (ref 36.0–46.0)
Hemoglobin: 12.1 g/dL (ref 12.0–15.0)
MCH: 26.9 pg (ref 26.0–34.0)
MCHC: 32.7 g/dL (ref 30.0–36.0)
MCV: 82.2 fL (ref 80.0–100.0)
Platelets: 275 10*3/uL (ref 150–400)
RBC: 4.5 MIL/uL (ref 3.87–5.11)
RDW: 13.2 % (ref 11.5–15.5)
WBC: 8.6 10*3/uL (ref 4.0–10.5)
nRBC: 0 % (ref 0.0–0.2)

## 2023-04-05 LAB — HEPARIN LEVEL (UNFRACTIONATED)
Heparin Unfractionated: 0.5 IU/mL (ref 0.30–0.70)
Heparin Unfractionated: 0.64 IU/mL (ref 0.30–0.70)
Heparin Unfractionated: 0.53 IU/mL (ref 0.30–0.70)

## 2023-04-05 LAB — BASIC METABOLIC PANEL
Anion gap: 13 (ref 5–15)
BUN: 20 mg/dL (ref 8–23)
CO2: 23 mmol/L (ref 22–32)
Calcium: 9.6 mg/dL (ref 8.9–10.3)
Chloride: 97 mmol/L — ABNORMAL LOW (ref 98–111)
Creatinine, Ser: 1.31 mg/dL — ABNORMAL HIGH (ref 0.44–1.00)
GFR, Estimated: 44 mL/min — ABNORMAL LOW (ref >60)
Glucose, Bld: 127 mg/dL — ABNORMAL HIGH (ref 70–99)
Potassium: 4.2 mmol/L (ref 3.5–5.1)
Sodium: 133 mmol/L — ABNORMAL LOW (ref 135–145)

## 2023-04-05 LAB — BRAIN NATRIURETIC PEPTIDE: B Natriuretic Peptide: 690.8 pg/mL — ABNORMAL HIGH (ref 0.0–100.0)

## 2023-04-05 LAB — GLUCOSE, CAPILLARY
Glucose-Capillary: 139 mg/dL — ABNORMAL HIGH (ref 70–99)
Glucose-Capillary: 233 mg/dL — ABNORMAL HIGH (ref 70–99)
Glucose-Capillary: 77 mg/dL (ref 70–99)
Glucose-Capillary: 137 mg/dL — ABNORMAL HIGH (ref 70–99)

## 2023-04-05 MED ORDER — IVABRADINE HCL 5 MG PO TABS
5.0000 mg | ORAL_TABLET | Freq: Two times a day (BID) | ORAL | Status: DC
  Administered 2023-04-05 – 2023-04-10 (×10): 5 mg via ORAL
  Filled 2023-04-05 (×15): qty 1

## 2023-04-05 NOTE — Progress Notes (Signed)
ANTICOAGULATION CONSULT NOTE   Pharmacy Consult for heparin Indication: chest pain/ACS  Allergies  Allergen Reactions   Codeine Nausea Only    Patient Measurements: Height: 5' 7.5" (171.5 cm) Weight: 75.6 kg (166 lb 10.7 oz) IBW/kg (Calculated) : 62.75 Heparin Dosing Weight: 74.4kg  Vital Signs: Temp: 98.4 F (36.9 C) (06/16 1100) Temp Source: Oral (06/16 1100) BP: 109/72 (06/16 1345) Pulse Rate: 110 (06/16 1200)  Labs: Recent Labs    04/04/23 0500 04/04/23 0503 04/04/23 0602 04/04/23 0620 04/04/23 0630 04/04/23 0635 04/05/23 0040 04/05/23 0554 04/05/23 1257  HGB 12.0  --  11.6* 10.8* 11.2*  11.6* 11.6* 12.1  --   --   HCT 37.5  --  34.0* 33.2* 33.0*  34.0* 34.0* 37.0  --   --   PLT 267  --   --  243  --   --  275  --   --   APTT 31  --   --  >200*  --   --   --   --   --   LABPROT 12.7  --   --  13.8  --   --   --   --   --   INR 0.9  --   --  1.0  --   --   --   --   --   HEPARINUNFRC  --   --   --   --   --   --  0.50 0.64 0.53  CREATININE  --    < > 1.10* 1.16*  --   --  1.31*  --   --   TROPONINIHS 171*  --   --  139*  --   --   --   --   --    < > = values in this interval not displayed.     Estimated Creatinine Clearance: 43.4 mL/min (A) (by C-G formula based on SCr of 1.31 mg/dL (H)).   Medical History: Past Medical History:  Diagnosis Date   Hypercholesteremia    Hypertension    Periorbital edema of right eye admitted 10/07/2016   Type II diabetes mellitus [facility])    Assessment: 67 yo female reports chest pian for over a week. Endorses high BP at home.  Pharmacy consulted to dose heparin for ACS.  No prior AC noted  Heparin level at goal this afternoon on heparin at 900 units/hr.  No overt bleeding or complications noted.  Goal of Therapy:  Heparin level 0.3-0.7 units/ml Monitor platelets by anticoagulation protocol: Yes   Plan:  Continue IV heparin at 900 units/hr Daily CBC, heparin level.  Reece Leader, Colon Flattery, BCCP Clinical  Pharmacist  04/05/2023 2:55 PM   [facility] pharmacy phone numbers are listed on [email or web address]

## 2023-04-05 NOTE — Progress Notes (Signed)
1 Day Post-Op Procedure(s) (LRB): RIGHT/LEFT HEART CATH AND CORONARY ANGIOGRAPHY (N/A) Subjective:  No chest pain or shortness of breath overnight. Slept well. Feels better this am.  Objective: Vital signs in last 24 hours: Temp:  [98.1 F (36.7 C)-98.7 F (37.1 C)] 98.5 F (36.9 C) (06/16 0815) Pulse Rate:  [77-116] 94 (06/16 0915) Cardiac Rhythm: Normal sinus rhythm;Sinus tachycardia (06/16 0800) Resp:  [14-25] 21 (06/16 0915) BP: (100-153)/(56-136) 127/72 (06/16 0915) SpO2:  [94 %-100 %] 96 % (06/16 0915) Weight:  [75.6 kg] 75.6 kg (06/16 0600)  Hemodynamic parameters for last 24 hours:    Intake/Output from previous day: 06/15 0701 - 06/16 0700 In: 643.4 [I.V.:643.4] Out: 2500 [Urine:2500] Intake/Output this shift: No intake/output data recorded.  General appearance: alert and cooperative Heart: regular rate and rhythm, S1, S2 normal, no murmur Lungs: clear to auscultation bilaterally  Lab Results: Recent Labs    04/04/23 0620 04/04/23 0630 04/04/23 0635 04/05/23 0040  WBC 6.6  --   --  8.6  HGB 10.8*   < > 11.6* 12.1  HCT 33.2*   < > 34.0* 37.0  PLT 243  --   --  275   < > = values in this interval not displayed.   BMET:  Recent Labs    04/04/23 0620 04/04/23 0630 04/04/23 0635 04/05/23 0040  NA 133*   < > 136 133*  K 3.9   < > 4.2 4.2  CL 101  --   --  97*  CO2 22  --   --  23  GLUCOSE 191*  --   --  127*  BUN 24*  --   --  20  CREATININE 1.16*  --   --  1.31*  CALCIUM 9.4  --   --  9.6   < > = values in this interval not displayed.    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 Pain free on heparin and NTG. Plan arterial dopplers and vein mapping tomorrow. Tentatively plan CABG Tuesday by Dr. Cliffton Asters if he agrees with plan when he returns.  LOS: 1 day    Alleen Borne 04/05/2023

## 2023-04-05 NOTE — Progress Notes (Addendum)
Subjective:  Feels well Chest pain free   Current Facility-Administered Medications:    0.9 %  sodium chloride infusion, , Intravenous, Continuous, Garlon Hatchet, PA-C, Last Rate: 20 mL/hr at 04/05/23 0400, Infusion Verify at 04/05/23 0400   0.9 %  sodium chloride infusion, 250 mL, Intravenous, PRN, Johnie Stadel J, MD   acetaminophen (TYLENOL) tablet 500 mg, 500 mg, Oral, Q6H PRN, Miranda Frese J, MD, 500 mg at 04/04/23 1700   aspirin EC tablet 81 mg, 81 mg, Oral, Daily, Tiffanye Hartmann J, MD   Chlorhexidine Gluconate Cloth 2 % PADS 6 each, 6 each, Topical, Daily, Shams Fill J, MD, 6 each at 04/04/23 1000   empagliflozin (JARDIANCE) tablet 10 mg, 10 mg, Oral, Daily, Evander Macaraeg J, MD, 10 mg at 04/04/23 0923   furosemide (LASIX) injection 40 mg, 40 mg, Intravenous, Daily, Arlette Schaad J, MD, 40 mg at 04/04/23 0923   heparin ADULT infusion 100 units/mL (25000 units/270mL), 900 Units/hr, Intravenous, Continuous, Carney, Jessica C, RPH, Last Rate: 9 mL/hr at 04/05/23 0400, 900 Units/hr at 04/05/23 0400   insulin aspart (novoLOG) injection 0-15 Units, 0-15 Units, Subcutaneous, TID WC, Broc Caspers J, MD, 2 Units at 04/04/23 1224   insulin aspart (novoLOG) injection 0-5 Units, 0-5 Units, Subcutaneous, QHS, Ainsley Deakins J, MD   insulin aspart (novoLOG) injection 4 Units, 4 Units, Subcutaneous, TID WC, Lenny Bouchillon J, MD   nitroGLYCERIN 50 mg in dextrose 5 % 250 mL (0.2 mg/mL) infusion, 2-200 mcg/min, Intravenous, Titrated, Analaura Messler J, MD, Stopped at 04/04/23 1948   ondansetron (ZOFRAN) injection 4 mg, 4 mg, Intravenous, Q6H PRN, Gershon Shorten J, MD, 4 mg at 04/04/23 0708   Oral care mouth rinse, 15 mL, Mouth Rinse, PRN, Emmie Frakes J, MD   rosuvastatin (CRESTOR) tablet 20 mg, 20 mg, Oral, Daily, Keegen Heffern J, MD, 20 mg at 04/04/23 2130   sacubitril-valsartan (ENTRESTO) 24-26 mg per tablet, 1 tablet, Oral, BID,  Lety Cullens J, MD, 1 tablet at 04/04/23 2127   sodium chloride flush (NS) 0.9 % injection 3 mL, 3 mL, Intravenous, Q12H, Shaleigh Laubscher J, MD, 3 mL at 04/04/23 2127   sodium chloride flush (NS) 0.9 % injection 3 mL, 3 mL, Intravenous, PRN, Celines Femia J, MD   spironolactone (ALDACTONE) tablet 25 mg, 25 mg, Oral, Daily, Ayyub Krall J, MD, 25 mg at 04/04/23 1700   Objective:  Vital Signs in the last 24 hours: Temp:  [98.1 F (36.7 C)-98.7 F (37.1 C)] 98.3 F (36.8 C) (06/16 0400) Pulse Rate:  [0-116] 93 (06/16 0500) Resp:  [15-26] 20 (06/16 0500) BP: (108-162)/(67-136) 124/72 (06/16 0500) SpO2:  [93 %-100 %] 97 % (06/16 0500)  Intake/Output from previous day: 06/15 0701 - 06/16 0700 In: 585.4 [I.V.:585.4] Out: 2500 [Urine:2500]  Physical Exam Vitals and nursing note reviewed.  Constitutional:      General: She is not in acute distress. Neck:     Vascular: No JVD.  Cardiovascular:     Rate and Rhythm: Normal rate and regular rhythm.     Heart sounds: Normal heart sounds. No murmur heard. Pulmonary:     Effort: Pulmonary effort is normal.     Breath sounds: Normal breath sounds. No wheezing or rales.  Musculoskeletal:     Right lower leg: No edema.     Left lower leg: No edema.      Imaging/tests reviewed and independently interpreted:  CXR 6/15/20224: 1. Diffuse interstitial prominence and peribronchial cuffing, concerning for probable bronchitis. 2. Cardiomegaly  with prominence of the left ventricular contour, suggestive of left ventricular hypertrophy.     Cardiac Studies:  Telemetry 04/05/2023: No significant arrhthymias   EKG 04/04/2023: Sinus tachycardia Severe diffuse ischemic changes  Coronary angiography 04/04/2025: LM: No distinct left main appreciable with apparent dual ostial to LAD and Lcx LAD: Prox 80%, followed by diffuse mid 90% stenoses Lcx: Large, codominant        Prox 50%, mid and distal 90% stenoses        Small  OM1 with ostial 80% stenosis        Lateral branch of OM3 with prox 80% stenosis RCA: Small, codominant         Prox 90% stenosis, followed by mid occlusion, chronic         Left-to-right collaterals from LAD septals to distal RCA   LVEDP 35 mmHg Severe global hypokinesis, LVEF <20%   RHC was performed given severely reduced LVEF and tachycardia   RA: 6 mmHg RV: 40/6 mmHg PA: 41/23 mmHg, mPAP 31 mmHg PCW: 19 mmHg   CO: 6.9 L/min CI: 3.7 L/min/m2   Conclusion: Severe multivessel CAD No one culprit vessel amenable to PCI Severely reduced LVEF, likely ischemic cardiomyopathy Decompensation with cardiogenic shock Mild PH, WHO Grp II  Echocardiogram 04/04/2023:  1. Severe hypokinesis of anterior, anteroseptalm apical wall. Mild  hypokinesis of basal-mid inferolateral wall. . Left ventricular ejection  fraction, by estimation, is 25 to 30%. The left ventricle has severely  decreased function. The left ventricle  demonstrates regional wall motion abnormalities (see scoring  diagram/findings for description). There is mild left ventricular  hypertrophy. Left ventricular diastolic parameters are consistent with  Grade I diastolic dysfunction (impaired relaxation).  There is severe hypokinesis of the left ventricular, entire anterior wall,  apical segment and anteroseptal wall. There is mild hypokinesis of the  left ventricular, basal-mid inferolateral wall.   2. Right ventricular systolic function is normal. The right ventricular  size is normal.   3. The mitral valve is normal in structure. No evidence of mitral valve  regurgitation. No evidence of mitral stenosis.   4. The aortic valve is normal in structure. Aortic valve regurgitation is  not visualized. No aortic stenosis is present.     Assessment & Recommendations:  67 y.o. African-American female with hypertension, hyperlipidemia, type 2 SM, s/p Rt nephrectomy, admitted with chest pain, EKG concerning for global  ischemia  NSTEMI: Severe multivessel CAD. Appreciate Dr. Sharee Pimple input.  Awaiting CABG pending optimization of heart failure. Surgery tentatively on Tuesday 6/18. Continue Aspirin, heparin, statin, IV nitroglycerin   HFrEF: Ischemic cardiomyopathy Diuresing well, net -ve 2.6 L on IV lasix 40 mg bid.  Sherryll Burger and Jardiance held due to perioperative hypotension and gastri motility concerns. Continue spironolactone 25 mg daily. Will introduce corlanor 5 mg bid today. If BP stays high, could add Bidil or metoprolol succinate, otherwise will start post op.  Hypertension: Controlled  Type 2 DM: Controlled SSI  Mixed hyperlipidemia: LDL 111. Simvastatin changed to Crestor 20 mg daily.    Okay to transfer to progressive care today.    Elder Negus, MD Pager: [phone] Office: [phone]

## 2023-04-05 NOTE — Progress Notes (Signed)
ANTICOAGULATION CONSULT NOTE  Pharmacy Consult for heparin Indication: chest pain/ACS Brief A/P: Heparin level within goal range Continue Heparin at current rate  Allergies  Allergen Reactions   Codeine Nausea Only    Patient Measurements: Height: 5' 7.5" (171.5 cm) Weight: 74.4 kg (164 lb) IBW/kg (Calculated) : 62.75 Heparin Dosing Weight: 74.4kg  Vital Signs: Temp: 98.1 F (36.7 C) (06/16 0000) Temp Source: Oral (06/15 2000) BP: 125/73 (06/16 0100) Pulse Rate: 96 (06/16 0100)  Labs: Recent Labs    04/04/23 0500 04/04/23 0503 04/04/23 0602 04/04/23 0620 04/04/23 0630 04/04/23 0635 04/05/23 0040  HGB 12.0  --  11.6* 10.8* 11.2*  11.6* 11.6* 12.1  HCT 37.5  --  34.0* 33.2* 33.0*  34.0* 34.0* 37.0  PLT 267  --   --  243  --   --  275  APTT 31  --   --  >200*  --   --   --   LABPROT 12.7  --   --  13.8  --   --   --   INR 0.9  --   --  1.0  --   --   --   HEPARINUNFRC  --   --   --   --   --   --  0.50  CREATININE  --    < > 1.10* 1.16*  --   --  1.31*  TROPONINIHS 171*  --   --  139*  --   --   --    < > = values in this interval not displayed.     Estimated Creatinine Clearance: 40.2 mL/min (A) (by C-G formula based on SCr of 1.31 mg/dL (H)).  Assessment: 67 y.o. female with STEMI s/p cath, awaiting CABG, for heparin  Goal of Therapy:  Heparin level 0.3-0.7 units/ml Monitor platelets by anticoagulation protocol: Yes   Plan:  Continue Heparin at current rate   Geannie Risen, PharmD, BCPS

## 2023-04-06 ENCOUNTER — Inpatient Hospital Stay (HOSPITAL_COMMUNITY): Payer: Medicare (Managed Care)

## 2023-04-06 ENCOUNTER — Encounter (HOSPITAL_COMMUNITY): Payer: Self-pay | Admitting: Cardiology

## 2023-04-06 DIAGNOSIS — Z01810 Encounter for preprocedural cardiovascular examination: Secondary | ICD-10-CM

## 2023-04-06 DIAGNOSIS — I214 Non-ST elevation (NSTEMI) myocardial infarction: Secondary | ICD-10-CM | POA: Diagnosis not present

## 2023-04-06 LAB — CBC
HCT: 40.4 % (ref 36.0–46.0)
Hemoglobin: 12.8 g/dL (ref 12.0–15.0)
MCH: 27 pg (ref 26.0–34.0)
MCHC: 31.7 g/dL (ref 30.0–36.0)
MCV: 85.2 fL (ref 80.0–100.0)
Platelets: 219 10*3/uL (ref 150–400)
RBC: 4.74 MIL/uL (ref 3.87–5.11)
RDW: 13.5 % (ref 11.5–15.5)
WBC: 8.1 10*3/uL (ref 4.0–10.5)
nRBC: 0 % (ref 0.0–0.2)

## 2023-04-06 LAB — GLUCOSE, CAPILLARY
Glucose-Capillary: 143 mg/dL — ABNORMAL HIGH (ref 70–99)
Glucose-Capillary: 110 mg/dL — ABNORMAL HIGH (ref 70–99)
Glucose-Capillary: 132 mg/dL — ABNORMAL HIGH (ref 70–99)
Glucose-Capillary: 180 mg/dL — ABNORMAL HIGH (ref 70–99)
Glucose-Capillary: 135 mg/dL — ABNORMAL HIGH (ref 70–99)

## 2023-04-06 LAB — HEPARIN LEVEL (UNFRACTIONATED): Heparin Unfractionated: 0.46 IU/mL (ref 0.30–0.70)

## 2023-04-06 MED ORDER — SODIUM CHLORIDE 0.9 % IV BOLUS
500.0000 mL | Freq: Once | INTRAVENOUS | Status: AC
  Administered 2023-04-06: 500 mL via INTRAVENOUS

## 2023-04-06 NOTE — Progress Notes (Signed)
Heart Failure Navigator Progress Note  Assessed for Heart & Vascular TOC clinic readiness.  Patient does not meet criteria due to [facility] patient.   Navigator will sign off at this time.   Jozelyn Kuwahara, BSN, RN Heart Failure Nurse Navigator Secure Chat Only   

## 2023-04-06 NOTE — Progress Notes (Signed)
CARDIAC REHAB PHASE I   PRE:  Rate/Rhythm: 83 SR lying in bed    BP: supine in bed 113/84    SpO2: 100 RA  MODE:  Ambulation: to BR then around bed to recliner   POST:  Rate/Rhythm: 120 ST while standing    BP: sitting 87/31 upon sitting, LOC after taken     SpO2: 98 RA   Pt eager to get out of bed (none since admit). 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 HR up rapidly walking to BR, 120 ST. C/o wooziness but able to urinate sitting and change her gown. Upon standing c/o more wooziness therefore did not ambulate in hall and had pt move to recliner. Upon sitting BP 87/31, HR 110 ST, pt still lightheaded. Pt then had LOC in recliner, several seconds. Laid pt back in recliner and gained consciousness. BP initially did not register. HR dropped to 51 SB initially while lying back but recovered to 70s. Staff present and initiated fluids. HR and BP normalized. Eventually pt moved back to bed.   Discussed with pt, daughter and friend IS (currently 2000 ml), sternal precautions, mobility post op, and d/c planning. 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 Daughter will be with her at d/c. Gave materials. [id number]  Ethelda Chick BS, ACSM-CEP 04/06/2023 2:23 PM

## 2023-04-06 NOTE — Progress Notes (Addendum)
Subjective:  Feels well No chest pain  In good spirits   Current Facility-Administered Medications:    0.9 %  sodium chloride infusion, , Intravenous, Continuous, Garlon Hatchet, PA-C, Stopped at 04/05/23 1700   0.9 %  sodium chloride infusion, 250 mL, Intravenous, PRN, Parish Augustine J, MD   acetaminophen (TYLENOL) tablet 500 mg, 500 mg, Oral, Q6H PRN, Breydan Shillingburg J, MD, 500 mg at 04/04/23 1700   aspirin EC tablet 81 mg, 81 mg, Oral, Daily, Dystany Duffy J, MD, 81 mg at 04/06/23 0907   furosemide (LASIX) injection 40 mg, 40 mg, Intravenous, Daily, Cray Monnin J, MD, 40 mg at 04/06/23 0907   heparin ADULT infusion 100 units/mL (25000 units/243mL), 900 Units/hr, Intravenous, Continuous, Carney, Jessica C, RPH, Last Rate: 9 mL/hr at 04/06/23 0427, 900 Units/hr at 04/06/23 0427   insulin aspart (novoLOG) injection 0-15 Units, 0-15 Units, Subcutaneous, TID WC, Britain Saber J, MD, 2 Units at 04/06/23 0640   insulin aspart (novoLOG) injection 0-5 Units, 0-5 Units, Subcutaneous, QHS, Collette Pescador J, MD   insulin aspart (novoLOG) injection 4 Units, 4 Units, Subcutaneous, TID WC, Nieko Clarin J, MD, 4 Units at 04/06/23 0642   ivabradine (CORLANOR) tablet 5 mg, 5 mg, Oral, BID WC, Aeon Kessner J, MD, 5 mg at 04/06/23 0907   nitroGLYCERIN 50 mg in dextrose 5 % 250 mL (0.2 mg/mL) infusion, 2-200 mcg/min, Intravenous, Titrated, Sherri Levenhagen J, MD, Stopped at 04/04/23 1948   ondansetron (ZOFRAN) injection 4 mg, 4 mg, Intravenous, Q6H PRN, Sheron Robin J, MD, 4 mg at 04/04/23 0708   Oral care mouth rinse, 15 mL, Mouth Rinse, PRN, Lakara Weiland J, MD   rosuvastatin (CRESTOR) tablet 20 mg, 20 mg, Oral, Daily, Toniya Rozar J, MD, 20 mg at 04/06/23 0907   sodium chloride flush (NS) 0.9 % injection 3 mL, 3 mL, Intravenous, Q12H, Ladell Lea J, MD, 3 mL at 04/06/23 0850   sodium chloride flush (NS) 0.9 % injection 3 mL, 3 mL, Intravenous,  PRN, Alexandar Weisenberger J, MD   spironolactone (ALDACTONE) tablet 25 mg, 25 mg, Oral, Daily, Atalaya Zappia J, MD, 25 mg at 04/06/23 0907   Objective:  Vital Signs in the last 24 hours: Temp:  [98 F (36.7 C)-98.5 F (36.9 C)] 98.4 F (36.9 C) (06/17 0815) Pulse Rate:  [77-111] 77 (06/17 0815) Resp:  [14-24] 20 (06/17 0815) BP: (84-137)/(51-86) 134/51 (06/17 0815) SpO2:  [97 %-100 %] 99 % (06/17 0815)  Intake/Output from previous day: 06/16 0701 - 06/17 0700 In: 746 [P.O.:240; I.V.:506] Out: 350 [Urine:350]  Physical Exam Vitals and nursing note reviewed.  Constitutional:      General: She is not in acute distress. Neck:     Vascular: No JVD.  Cardiovascular:     Rate and Rhythm: Normal rate and regular rhythm.     Heart sounds: Normal heart sounds. No murmur heard. Pulmonary:     Effort: Pulmonary effort is normal.     Breath sounds: Normal breath sounds. No wheezing or rales.  Musculoskeletal:     Right lower leg: No edema.     Left lower leg: No edema.    No change compared to 04/05/2023  Imaging/tests reviewed and independently interpreted:  CXR 6/15/20224: 1. Diffuse interstitial prominence and peribronchial cuffing, concerning for probable bronchitis. 2. Cardiomegaly with prominence of the left ventricular contour, suggestive of left ventricular hypertrophy.     Cardiac Studies:  Telemetry 04/05/2023: No significant arrhthymias   EKG 04/04/2023: Sinus tachycardia Severe diffuse  ischemic changes  Coronary angiography 04/04/2025: LM: No distinct left main appreciable with apparent dual ostial to LAD and Lcx LAD: Prox 80%, followed by diffuse mid 90% stenoses Lcx: Large, codominant        Prox 50%, mid and distal 90% stenoses        Small OM1 with ostial 80% stenosis        Lateral branch of OM3 with prox 80% stenosis RCA: Small, codominant         Prox 90% stenosis, followed by mid occlusion, chronic         Left-to-right collaterals from LAD  septals to distal RCA   LVEDP 35 mmHg Severe global hypokinesis, LVEF <20%   RHC was performed given severely reduced LVEF and tachycardia   RA: 6 mmHg RV: 40/6 mmHg PA: 41/23 mmHg, mPAP 31 mmHg PCW: 19 mmHg   CO: 6.9 L/min CI: 3.7 L/min/m2   Conclusion: Severe multivessel CAD No one culprit vessel amenable to PCI Severely reduced LVEF, likely ischemic cardiomyopathy Decompensation with cardiogenic shock Mild PH, WHO Grp II  Echocardiogram 04/04/2023:  1. Severe hypokinesis of anterior, anteroseptalm apical wall. Mild  hypokinesis of basal-mid inferolateral wall. . Left ventricular ejection  fraction, by estimation, is 25 to 30%. The left ventricle has severely  decreased function. The left ventricle  demonstrates regional wall motion abnormalities (see scoring  diagram/findings for description). There is mild left ventricular  hypertrophy. Left ventricular diastolic parameters are consistent with  Grade I diastolic dysfunction (impaired relaxation).  There is severe hypokinesis of the left ventricular, entire anterior wall,  apical segment and anteroseptal wall. There is mild hypokinesis of the  left ventricular, basal-mid inferolateral wall.   2. Right ventricular systolic function is normal. The right ventricular  size is normal.   3. The mitral valve is normal in structure. No evidence of mitral valve  regurgitation. No evidence of mitral stenosis.   4. The aortic valve is normal in structure. Aortic valve regurgitation is  not visualized. No aortic stenosis is present.     Assessment & Recommendations:  67 y.o. African-American female with hypertension, hyperlipidemia, type 2 SM, s/p Rt nephrectomy, admitted with chest pain, EKG concerning for global ischemia  NSTEMI: Severe multivessel CAD. Appreciate Dr. Sharee Pimple input.  Awaiting CABG pending optimization of heart failure. Surgery tentatively on Tuesday 6/18. Continue Aspirin, heparin, statin, IV  nitroglycerin   HFrEF: Ischemic cardiomyopathy Diuresing well, net -ve 2.6 L on IV lasix 40 mg bid.  Sherryll Burger and Jardiance held due to perioperative hypotension and gastri motility concerns. Continue spironolactone 25 mg daily, corlanor 5 mg bid today. An add beta blocker (Metoprolol succiante 25 or 50 mg daily), and Entresto 24-26 mg bid, post op.  Hypertension: Controlled  Type 2 DM: Controlled SSI  Mixed hyperlipidemia: LDL 111. Continue Crestor 20 mg daily.    Okay to transfer to progressive care today.    Elder Negus, MD Pager: [phone] Office: [phone]

## 2023-04-06 NOTE — Progress Notes (Signed)
ANTICOAGULATION CONSULT NOTE   Pharmacy Consult for heparin Indication: chest pain/ACS  Allergies  Allergen Reactions   Codeine Nausea Only    Patient Measurements: Height: 5' 7.5" (171.5 cm) Weight: 75.6 kg (166 lb 10.7 oz) IBW/kg (Calculated) : 62.75 Heparin Dosing Weight: 74.4kg  Vital Signs: Temp: 98.4 F (36.9 C) (06/17 0815) Temp Source: Oral (06/17 0815) BP: 134/51 (06/17 0815) Pulse Rate: 77 (06/17 0815)  Labs: Recent Labs    04/04/23 0500 04/04/23 0503 04/04/23 0602 04/04/23 0620 04/04/23 0630 04/04/23 4098 04/04/23 0635 04/05/23 0040 04/05/23 0554 04/05/23 1257 04/06/23 0142  HGB 12.0  --  11.6* 10.8*   < > 11.6*  --  12.1  --   --  12.8  HCT 37.5  --  34.0* 33.2*   < > 34.0*  --  37.0  --   --  40.4  PLT 267  --   --  243  --   --   --  275  --   --  219  APTT 31  --   --  >200*  --   --   --   --   --   --   --   LABPROT 12.7  --   --  13.8  --   --   --   --   --   --   --   INR 0.9  --   --  1.0  --   --   --   --   --   --   --   HEPARINUNFRC  --   --   --   --   --   --    < > 0.50 0.64 0.53 0.46  CREATININE  --    < > 1.10* 1.16*  --   --   --  1.31*  --   --   --   TROPONINIHS 171*  --   --  139*  --   --   --   --   --   --   --    < > = values in this interval not displayed.     Estimated Creatinine Clearance: 43.4 mL/min (A) (by C-G formula based on SCr of 1.31 mg/dL (H)).   Medical History: Past Medical History:  Diagnosis Date   Hypercholesteremia    Hypertension    Periorbital edema of right eye admitted 10/07/2016   Type II diabetes mellitus [facility])    Assessment: 67 yo female reports chest pian for over a week. Endorses high BP at home.  Pharmacy consulted to dose heparin for ACS.  No prior AC noted  Heparin level remains therapeutic. Pending CABG in AM.   Goal of Therapy:  Heparin level 0.3-0.7 units/ml Monitor platelets by anticoagulation protocol: Yes   Plan:  Continue IV heparin at 900 units/hr Daily CBC, heparin  level.  Ulyses Southward, PharmD, BCIDP, AAHIVP, CPP Infectious Disease Pharmacist 04/06/2023 9:49 AM

## 2023-04-06 NOTE — TOC CM/SW Note (Signed)
Transition of Care [facility]) - Inpatient Brief Assessment   Patient Details  Name: Patricia Mooney MRN: [id number] Date of Birth: [birth year]  Transition of Care [facility]) CM/SW Contact:    Darrold Span, RN Phone Number: 04/06/2023, 12:05 PM   Clinical Narrative: Pt admitted w/ NSTEMI- note plan for CABG on 6/18- We will continue to monitor patient advancement through interdisciplinary progression rounds. If new patient transition needs arise, please place a TOC consult.   Transition of Care Asessment: Insurance and Status: Insurance coverage has been reviewed Patient has primary care physician: Yes Home environment has been reviewed: home Prior level of function:: independent Prior/Current Home Services: No current home services Social Determinants of Health Reivew: SDOH reviewed no interventions necessary Readmission risk has been reviewed: Yes Transition of care needs: no transition of care needs at this time

## 2023-04-06 NOTE — Progress Notes (Signed)
Cardiac rehab at bedside set emergency alarm off. Pt got up and went to the bathroom with cardiac rehab and when walking back passed out. BP dropped and pt placed in chair flat. MD notified 500 ml bolus order placed . Pt transferred back to bed BP 106/67. Pt stated still feels light headed   Everlean Cherry, RN   04/06/23 1351  Vitals  BP (!) 87/31  MAP (mmHg) (!) 49  BP Location Left Arm  BP Method Automatic  Patient Position (if appropriate) Lying  Pulse Rate 72  Pulse Rate Source Monitor  ECG Heart Rate 96  Resp (!) 23  Level of Consciousness  Level of Consciousness Alert  Oxygen Therapy  SpO2 100 %  O2 Device Room Air  O2 Flow Rate (L/min) 0 L/min  Patient Activity (if Appropriate) In chair  MEWS Score  MEWS Temp 0  MEWS Systolic 1  MEWS Pulse 0  MEWS RR 1  MEWS LOC 0  MEWS Score 2  MEWS Score Color Yellow

## 2023-04-07 ENCOUNTER — Inpatient Hospital Stay (HOSPITAL_COMMUNITY): Payer: Medicare (Managed Care)

## 2023-04-07 DIAGNOSIS — I340 Nonrheumatic mitral (valve) insufficiency: Secondary | ICD-10-CM

## 2023-04-07 DIAGNOSIS — Z01810 Encounter for preprocedural cardiovascular examination: Secondary | ICD-10-CM

## 2023-04-07 DIAGNOSIS — I361 Nonrheumatic tricuspid (valve) insufficiency: Secondary | ICD-10-CM | POA: Diagnosis not present

## 2023-04-07 DIAGNOSIS — I351 Nonrheumatic aortic (valve) insufficiency: Secondary | ICD-10-CM | POA: Diagnosis not present

## 2023-04-07 LAB — CBC
HCT: 37.4 % (ref 36.0–46.0)
Hemoglobin: 11.9 g/dL — ABNORMAL LOW (ref 12.0–15.0)
MCH: 26.2 pg (ref 26.0–34.0)
MCHC: 31.8 g/dL (ref 30.0–36.0)
MCV: 82.2 fL (ref 80.0–100.0)
Platelets: 266 10*3/uL (ref 150–400)
RBC: 4.55 MIL/uL (ref 3.87–5.11)
RDW: 13.4 % (ref 11.5–15.5)
WBC: 7.5 10*3/uL (ref 4.0–10.5)
nRBC: 0 % (ref 0.0–0.2)

## 2023-04-07 LAB — VAS US ABI WITH/WO TBI
Left ABI: 0.61
Right ABI: 0.49

## 2023-04-07 LAB — GLUCOSE, CAPILLARY
Glucose-Capillary: 118 mg/dL — ABNORMAL HIGH (ref 70–99)
Glucose-Capillary: 175 mg/dL — ABNORMAL HIGH (ref 70–99)
Glucose-Capillary: 124 mg/dL — ABNORMAL HIGH (ref 70–99)
Glucose-Capillary: 121 mg/dL — ABNORMAL HIGH (ref 70–99)

## 2023-04-07 LAB — HEPARIN LEVEL (UNFRACTIONATED): Heparin Unfractionated: 0.51 IU/mL (ref 0.30–0.70)

## 2023-04-07 LAB — LIPOPROTEIN A (LPA): Lipoprotein (a): 10.6 nmol/L (ref <75.0)

## 2023-04-07 MED ORDER — GADOBUTROL 1 MMOL/ML IV SOLN
10.0000 mL | Freq: Once | INTRAVENOUS | Status: AC | PRN
  Administered 2023-04-07: 10 mL via INTRAVENOUS

## 2023-04-07 MED ORDER — FUROSEMIDE 40 MG PO TABS
40.0000 mg | ORAL_TABLET | Freq: Every day | ORAL | Status: DC
  Administered 2023-04-08: 40 mg via ORAL
  Filled 2023-04-07: qty 1

## 2023-04-07 MED ORDER — LORAZEPAM 2 MG/ML IJ SOLN
2.0000 mg | Freq: Once | INTRAMUSCULAR | Status: AC
  Administered 2023-04-07: 2 mg via INTRAVENOUS
  Filled 2023-04-07: qty 1

## 2023-04-07 NOTE — Progress Notes (Signed)
Subjective:  Feels well Episode of lightheadedness on 6/17, no recurrence No chest pain  In good spirits   Current Facility-Administered Medications:    0.9 %  sodium chloride infusion, , Intravenous, Continuous, Garlon Hatchet, PA-C, Stopped at 04/05/23 1700   0.9 %  sodium chloride infusion, 250 mL, Intravenous, PRN, Paw Karstens J, MD   acetaminophen (TYLENOL) tablet 500 mg, 500 mg, Oral, Q6H PRN, Samanatha Brammer J, MD, 500 mg at 04/06/23 1747   aspirin EC tablet 81 mg, 81 mg, Oral, Daily, Zamere Pasternak J, MD, 81 mg at 04/07/23 0957   furosemide (LASIX) injection 40 mg, 40 mg, Intravenous, Daily, Gaddiel Cullens J, MD, 40 mg at 04/07/23 0956   heparin ADULT infusion 100 units/mL (25000 units/252mL), 900 Units/hr, Intravenous, Continuous, Carney, Gwenlyn Found, RPH, Last Rate: 9 mL/hr at 04/06/23 1845, 900 Units/hr at 04/06/23 1845   insulin aspart (novoLOG) injection 0-15 Units, 0-15 Units, Subcutaneous, TID WC, Taelor Moncada J, MD, 3 Units at 04/06/23 1839   insulin aspart (novoLOG) injection 0-5 Units, 0-5 Units, Subcutaneous, QHS, Osbaldo Mark J, MD   insulin aspart (novoLOG) injection 4 Units, 4 Units, Subcutaneous, TID WC, Chay Mazzoni J, MD, 4 Units at 04/06/23 0642   ivabradine (CORLANOR) tablet 5 mg, 5 mg, Oral, BID WC, Mckinze Poirier J, MD, 5 mg at 04/07/23 0956   LORazepam (ATIVAN) injection 2 mg, 2 mg, Intravenous, Once, Barrett, Erin R, PA-C   nitroGLYCERIN 50 mg in dextrose 5 % 250 mL (0.2 mg/mL) infusion, 2-200 mcg/min, Intravenous, Titrated, Brendaly Townsel J, MD, Stopped at 04/04/23 1948   ondansetron (ZOFRAN) injection 4 mg, 4 mg, Intravenous, Q6H PRN, Lura Falor J, MD, 4 mg at 04/04/23 0708   Oral care mouth rinse, 15 mL, Mouth Rinse, PRN, Katty Fretwell J, MD   rosuvastatin (CRESTOR) tablet 20 mg, 20 mg, Oral, Daily, Jaree Dwight J, MD, 20 mg at 04/07/23 0957   sodium chloride flush (NS) 0.9 % injection 3 mL, 3 mL,  Intravenous, Q12H, Elsie Sakuma J, MD, 3 mL at 04/07/23 0957   sodium chloride flush (NS) 0.9 % injection 3 mL, 3 mL, Intravenous, PRN, Dorsel Flinn J, MD   spironolactone (ALDACTONE) tablet 25 mg, 25 mg, Oral, Daily, Cobi Aldape J, MD, 25 mg at 04/07/23 0957   Objective:  Vital Signs in the last 24 hours: Temp:  [97.8 F (36.6 C)-98.7 F (37.1 C)] 98.3 F (36.8 C) (06/18 0757) Pulse Rate:  [65-85] 73 (06/18 0757) Resp:  [15-23] 17 (06/18 0757) BP: (87-135)/(31-101) 121/50 (06/18 0757) SpO2:  [96 %-100 %] 98 % (06/18 0757)  Intake/Output from previous day: 06/17 0701 - 06/18 0700 In: -  Out: 1250 [Urine:1250]  Physical Exam Vitals and nursing note reviewed.  Constitutional:      General: She is not in acute distress. Neck:     Vascular: No JVD.  Cardiovascular:     Rate and Rhythm: Normal rate and regular rhythm.     Heart sounds: Normal heart sounds. No murmur heard. Pulmonary:     Effort: Pulmonary effort is normal.     Breath sounds: Normal breath sounds. No wheezing or rales.  Musculoskeletal:     Right lower leg: No edema.     Left lower leg: No edema.    No change compared to 04/05/2023  Imaging/tests reviewed and independently interpreted:  CXR 6/15/20224: 1. Diffuse interstitial prominence and peribronchial cuffing, concerning for probable bronchitis. 2. Cardiomegaly with prominence of the left ventricular contour, suggestive of left ventricular  hypertrophy.     Cardiac Studies:  Telemetry 04/05/2023: No significant arrhthymias   EKG 04/04/2023: Sinus tachycardia Severe diffuse ischemic changes  Coronary angiography 04/04/2025: LM: No distinct left main appreciable with apparent dual ostial to LAD and Lcx LAD: Prox 80%, followed by diffuse mid 90% stenoses Lcx: Large, codominant        Prox 50%, mid and distal 90% stenoses        Small OM1 with ostial 80% stenosis        Lateral branch of OM3 with prox 80% stenosis RCA: Small,  codominant         Prox 90% stenosis, followed by mid occlusion, chronic         Left-to-right collaterals from LAD septals to distal RCA   LVEDP 35 mmHg Severe global hypokinesis, LVEF <20%   RHC was performed given severely reduced LVEF and tachycardia   RA: 6 mmHg RV: 40/6 mmHg PA: 41/23 mmHg, mPAP 31 mmHg PCW: 19 mmHg   CO: 6.9 L/min CI: 3.7 L/min/m2   Conclusion: Severe multivessel CAD No one culprit vessel amenable to PCI Severely reduced LVEF, likely ischemic cardiomyopathy Decompensation with cardiogenic shock Mild PH, WHO Grp II  Echocardiogram 04/04/2023:  1. Severe hypokinesis of anterior, anteroseptalm apical wall. Mild  hypokinesis of basal-mid inferolateral wall. . Left ventricular ejection  fraction, by estimation, is 25 to 30%. The left ventricle has severely  decreased function. The left ventricle  demonstrates regional wall motion abnormalities (see scoring  diagram/findings for description). There is mild left ventricular  hypertrophy. Left ventricular diastolic parameters are consistent with  Grade I diastolic dysfunction (impaired relaxation).  There is severe hypokinesis of the left ventricular, entire anterior wall,  apical segment and anteroseptal wall. There is mild hypokinesis of the  left ventricular, basal-mid inferolateral wall.   2. Right ventricular systolic function is normal. The right ventricular  size is normal.   3. The mitral valve is normal in structure. No evidence of mitral valve  regurgitation. No evidence of mitral stenosis.   4. The aortic valve is normal in structure. Aortic valve regurgitation is  not visualized. No aortic stenosis is present.     Assessment & Recommendations:  67 y.o. African-American female with hypertension, hyperlipidemia, type 2 SM, s/p Rt nephrectomy, admitted with chest pain, EKG concerning for global ischemia  NSTEMI: Severe multivessel CAD. Appreciate Dr. Sharee Pimple input.  Awaiting CABG pending  optimization of heart failure. Surgery tentatively on Tuesday 6/18. Continue Aspirin, heparin, statin, IV nitroglycerin  Discussed with Dr. Cliffton Asters. Awaiting cardiac MRI to assess viability in anterior wall   HFrEF: Ischemic cardiomyopathy Diuresing well, net -ve 3.4 L. Will reduce lasix to PO 40 mg daily. Sherryll Burger and Jardiance held due to perioperative hypotension and gastri motility concerns. Continue spironolactone 25 mg daily, corlanor 5 mg bid today. Can add beta blocker (Metoprolol succiante 25 or 50 mg daily), and Entresto 24-26 mg bid, post op.  Hypertension: Controlled  Type 2 DM: Controlled SSI  Mixed hyperlipidemia: LDL 111. Continue Crestor 20 mg daily.       Elder Negus, MD Pager: [phone] Office: [phone]

## 2023-04-07 NOTE — Progress Notes (Signed)
     [address]       [place] [address]             [phone]       Discussed case with heart failure team. Will obtain cardiac MRI to assess viability of LAD territory.  Additionally, she does not appear to have good vein conduit.  Use of the left radial artery is an option, but this will not achieve complete revascularization.  If the MRI shows good viability, then she will require a hybrid approach if we are to proceed with surgery.  Kamuela Magos Keane Scrape

## 2023-04-07 NOTE — Progress Notes (Signed)
CARDIAC REHAB PHASE I   Pt resting in bed, feeling well today. Pt to have additional testing today, to assess and plan surgical options. Pt has no questions or concerns regarding pre-op education provided yesterday. Will continue to follow.   [id number] Woodroe Chen, RN BSN 04/07/2023 10:33 AM

## 2023-04-07 NOTE — Progress Notes (Signed)
ANTICOAGULATION CONSULT NOTE   Pharmacy Consult for heparin Indication: chest pain/ACS  Allergies  Allergen Reactions   Codeine Nausea Only    Patient Measurements: Height: 5' 7.5" (171.5 cm) Weight: 75.6 kg (166 lb 10.7 oz) IBW/kg (Calculated) : 62.75 Heparin Dosing Weight: 74.4kg  Vital Signs: Temp: 98.3 F (36.8 C) (06/18 0757) Temp Source: Oral (06/18 0757) BP: 121/50 (06/18 0757) Pulse Rate: 73 (06/18 0757)  Labs: Recent Labs    04/05/23 0040 04/05/23 0554 04/05/23 1257 04/06/23 0142 04/07/23 0133  HGB 12.1  --   --  12.8 11.9*  HCT 37.0  --   --  40.4 37.4  PLT 275  --   --  219 266  HEPARINUNFRC 0.50   < > 0.53 0.46 0.51  CREATININE 1.31*  --   --   --   --    < > = values in this interval not displayed.     Estimated Creatinine Clearance: 43.4 mL/min (A) (by C-G formula based on SCr of 1.31 mg/dL (H)).   Medical History: Past Medical History:  Diagnosis Date   Hypercholesteremia    Hypertension    Periorbital edema of right eye admitted 10/07/2016   Type II diabetes mellitus [facility])    Assessment: 67 yo female reports chest pian for over a week. Endorses high BP at home.  Pharmacy consulted to dose heparin for ACS.  No prior AC noted  Heparin level remains therapeutic. Pending CABG. CBC remains stable.   Goal of Therapy:  Heparin level 0.3-0.7 units/ml Monitor platelets by anticoagulation protocol: Yes   Plan:  Continue IV heparin at 900 units/hr Daily CBC, heparin level.  Ulyses Southward, PharmD, BCIDP, AAHIVP, CPP Infectious Disease Pharmacist 04/07/2023 8:10 AM

## 2023-04-08 ENCOUNTER — Other Ambulatory Visit (HOSPITAL_COMMUNITY): Payer: Self-pay

## 2023-04-08 DIAGNOSIS — I5023 Acute on chronic systolic (congestive) heart failure: Secondary | ICD-10-CM

## 2023-04-08 DIAGNOSIS — R079 Chest pain, unspecified: Secondary | ICD-10-CM

## 2023-04-08 LAB — GLUCOSE, CAPILLARY
Glucose-Capillary: 125 mg/dL — ABNORMAL HIGH (ref 70–99)
Glucose-Capillary: 147 mg/dL — ABNORMAL HIGH (ref 70–99)
Glucose-Capillary: 143 mg/dL — ABNORMAL HIGH (ref 70–99)
Glucose-Capillary: 164 mg/dL — ABNORMAL HIGH (ref 70–99)

## 2023-04-08 LAB — BASIC METABOLIC PANEL
Anion gap: 10 (ref 5–15)
BUN: 38 mg/dL — ABNORMAL HIGH (ref 8–23)
CO2: 22 mmol/L (ref 22–32)
Calcium: 9.2 mg/dL (ref 8.9–10.3)
Chloride: 101 mmol/L (ref 98–111)
Creatinine, Ser: 1.77 mg/dL — ABNORMAL HIGH (ref 0.44–1.00)
GFR, Estimated: 31 mL/min — ABNORMAL LOW (ref >60)
Glucose, Bld: 143 mg/dL — ABNORMAL HIGH (ref 70–99)
Potassium: 4.2 mmol/L (ref 3.5–5.1)
Sodium: 133 mmol/L — ABNORMAL LOW (ref 135–145)

## 2023-04-08 LAB — CBC
HCT: 41 % (ref 36.0–46.0)
Hemoglobin: 13.2 g/dL (ref 12.0–15.0)
MCH: 27 pg (ref 26.0–34.0)
MCHC: 32.2 g/dL (ref 30.0–36.0)
MCV: 84 fL (ref 80.0–100.0)
Platelets: 256 10*3/uL (ref 150–400)
RBC: 4.88 MIL/uL (ref 3.87–5.11)
RDW: 13.4 % (ref 11.5–15.5)
WBC: 6.3 10*3/uL (ref 4.0–10.5)
nRBC: 0 % (ref 0.0–0.2)

## 2023-04-08 LAB — HEPARIN LEVEL (UNFRACTIONATED): Heparin Unfractionated: 0.57 IU/mL (ref 0.30–0.70)

## 2023-04-08 LAB — CREATININE, SERUM
Creatinine, Ser: 1.74 mg/dL — ABNORMAL HIGH (ref 0.44–1.00)
GFR, Estimated: 31 mL/min — ABNORMAL LOW (ref >60)

## 2023-04-08 LAB — BUN: BUN: 40 mg/dL — ABNORMAL HIGH (ref 8–23)

## 2023-04-08 MED ORDER — SODIUM CHLORIDE 0.9 % IV BOLUS
250.0000 mL | Freq: Once | INTRAVENOUS | Status: AC
  Administered 2023-04-08: 250 mL via INTRAVENOUS

## 2023-04-08 MED ORDER — TICAGRELOR 90 MG PO TABS
180.0000 mg | ORAL_TABLET | Freq: Once | ORAL | Status: AC
  Administered 2023-04-08: 180 mg via ORAL
  Filled 2023-04-08: qty 2

## 2023-04-08 MED ORDER — SODIUM CHLORIDE 0.9 % IV SOLN: INTRAVENOUS | Status: AC

## 2023-04-08 MED ORDER — TICAGRELOR 90 MG PO TABS
90.0000 mg | ORAL_TABLET | Freq: Two times a day (BID) | ORAL | Status: DC
  Administered 2023-04-09 – 2023-04-10 (×3): 90 mg via ORAL
  Filled 2023-04-08 (×3): qty 1

## 2023-04-08 NOTE — Progress Notes (Signed)
Patient orthostatic with standing, see flowsheet. Dr. Rosemary Holms made aware and orders received. Call bell within reach. Haig Gerardo, Randall An RN

## 2023-04-08 NOTE — TOC Benefit Eligibility Note (Signed)
Pharmacy Patient Advocate Encounter  Insurance verification completed.    The patient is insured through  [employer] test claim for [employer] and the current 30 day co-pay is $0.00.  Ran test claim for Entresto and the current 30 day co-pay is $0.00.  Ran test claim for Marcelline Deist and the current 30 day co-pay is . $0.00  Ran test claim for Jardiance and the current 30 day co-pay is $0.00.  This test claim was processed through [facility]- copay amounts may vary at other pharmacies due to pharmacy/plan contracts, or as the patient moves through the different stages of their insurance plan.

## 2023-04-08 NOTE — Progress Notes (Signed)
ANTICOAGULATION CONSULT NOTE   Pharmacy Consult for heparin Indication: chest pain/ACS  Allergies  Allergen Reactions   Codeine Nausea Only    Patient Measurements: Height: 5' 7.5" (171.5 cm) Weight: 75.6 kg (166 lb 10.7 oz) IBW/kg (Calculated) : 62.75 Heparin Dosing Weight: 74.4kg  Vital Signs: Temp: 97.6 F (36.4 C) (06/19 0613) Temp Source: Oral (06/19 0613) BP: 122/68 (06/19 1610) Pulse Rate: 79 (06/19 0613)  Labs: Recent Labs    04/06/23 0142 04/07/23 0133 04/08/23 0056  HGB 12.8 11.9* 13.2  HCT 40.4 37.4 41.0  PLT 219 266 256  HEPARINUNFRC 0.46 0.51 0.57     Estimated Creatinine Clearance: 43.4 mL/min (A) (by C-G formula based on SCr of 1.31 mg/dL (H)).   Medical History: Past Medical History:  Diagnosis Date   Hypercholesteremia    Hypertension    Periorbital edema of right eye admitted 10/07/2016   Type II diabetes mellitus [facility])    Assessment: 67 yo female reports chest pian for over a week. Endorses high BP at home.  Pharmacy consulted to dose heparin for ACS.  No prior AC noted  Heparin level remains therapeutic. Plan to do cardiac MRI to assess vessels. Pending CABG. CBC remains stable.   Goal of Therapy:  Heparin level 0.3-0.7 units/ml Monitor platelets by anticoagulation protocol: Yes   Plan:  Continue IV heparin at 900 units/hr Daily CBC, heparin level.  Ulyses Southward, PharmD, BCIDP, AAHIVP, CPP Infectious Disease Pharmacist 04/08/2023 7:28 AM

## 2023-04-08 NOTE — Progress Notes (Signed)
Discussed with Dr. Gala Romney and the patient. Check Cr and repeat in am. If Cr better tomorrow, will proceed with targeted Lcx/OM PCI tomorrow. Gentle hydration starting tomorrow AM. Load with Brilinta 180 mg tonight, 90 mg bid from tomorrow.   Elder Negus, MD Pager: [phone] Office: [phone]

## 2023-04-08 NOTE — Progress Notes (Signed)
Orthostatic. Net -ve 4.6 L. Stopped spironolactone. 250 cc saline bolus, followed by 50 cc/hr for 6 hrs.   Elder Negus, MD Pager: [phone] Office: [phone]

## 2023-04-08 NOTE — Care Management Important Message (Signed)
Important Message  Patient Details  Name: Patricia Mooney MRN: [id number] Date of Birth: 1955/11/10   Medicare Important Message Given:  Yes     Dorena Bodo 04/08/2023, 3:24 PM

## 2023-04-08 NOTE — Progress Notes (Signed)
Subjective:  Feels well Episode of lightheadedness on 6/17, no recurrence No chest pain  In good spirits   Current Facility-Administered Medications:    0.9 %  sodium chloride infusion, , Intravenous, Continuous, Garlon Hatchet, PA-C, Stopped at 04/05/23 1700   0.9 %  sodium chloride infusion, 250 mL, Intravenous, PRN, Kathi Dohn J, MD   acetaminophen (TYLENOL) tablet 500 mg, 500 mg, Oral, Q6H PRN, Deyanira Fesler J, MD, 500 mg at 04/06/23 1747   aspirin EC tablet 81 mg, 81 mg, Oral, Daily, Daquana Paddock J, MD, 81 mg at 04/07/23 0957   furosemide (LASIX) tablet 40 mg, 40 mg, Oral, Daily, Anai Lipson J, MD   heparin ADULT infusion 100 units/mL (25000 units/274mL), 900 Units/hr, Intravenous, Continuous, Carney, Gwenlyn Found, RPH, Last Rate: 9 mL/hr at 04/07/23 2104, 900 Units/hr at 04/07/23 2104   insulin aspart (novoLOG) injection 0-15 Units, 0-15 Units, Subcutaneous, TID WC, Jaquala Fuller J, MD, 2 Units at 04/08/23 0629   insulin aspart (novoLOG) injection 0-5 Units, 0-5 Units, Subcutaneous, QHS, Shyna Duignan J, MD   insulin aspart (novoLOG) injection 4 Units, 4 Units, Subcutaneous, TID WC, Cherine Drumgoole J, MD, 4 Units at 04/06/23 0642   ivabradine (CORLANOR) tablet 5 mg, 5 mg, Oral, BID WC, Omnia Dollinger J, MD, 5 mg at 04/07/23 1729   nitroGLYCERIN 50 mg in dextrose 5 % 250 mL (0.2 mg/mL) infusion, 2-200 mcg/min, Intravenous, Titrated, Reily Ilic J, MD, Stopped at 04/04/23 1948   ondansetron (ZOFRAN) injection 4 mg, 4 mg, Intravenous, Q6H PRN, Victormanuel Mclure J, MD, 4 mg at 04/04/23 0708   Oral care mouth rinse, 15 mL, Mouth Rinse, PRN, Oskar Cretella J, MD   rosuvastatin (CRESTOR) tablet 20 mg, 20 mg, Oral, Daily, Buena Boehm J, MD, 20 mg at 04/07/23 0957   sodium chloride flush (NS) 0.9 % injection 3 mL, 3 mL, Intravenous, Q12H, Naomie Crow J, MD, 3 mL at 04/07/23 2106   sodium chloride flush (NS) 0.9 % injection 3 mL, 3  mL, Intravenous, PRN, Krystie Leiter J, MD   spironolactone (ALDACTONE) tablet 25 mg, 25 mg, Oral, Daily, Harun Brumley J, MD, 25 mg at 04/07/23 0957   Objective:  Vital Signs in the last 24 hours: Temp:  [97.4 F (36.3 C)-98.8 F (37.1 C)] 97.6 F (36.4 C) (06/19 1610) Pulse Rate:  [79-82] 79 (06/19 0613) Resp:  [18-20] 18 (06/19 0613) BP: (104-126)/(58-68) 122/68 (06/19 0613) SpO2:  [97 %-100 %] 100 % (06/19 9604)  Intake/Output from previous day: 06/18 0701 - 06/19 0700 In: -  Out: 1000 [Urine:1000]  Physical Exam Vitals and nursing note reviewed.  Constitutional:      General: She is not in acute distress. Neck:     Vascular: No JVD.  Cardiovascular:     Rate and Rhythm: Normal rate and regular rhythm.     Heart sounds: Normal heart sounds. No murmur heard. Pulmonary:     Effort: Pulmonary effort is normal.     Breath sounds: Normal breath sounds. No wheezing or rales.  Musculoskeletal:     Right lower leg: No edema.     Left lower leg: No edema.    No change compared to 04/05/2023  Imaging/tests reviewed and independently interpreted:  CXR 6/15/20224: 1. Diffuse interstitial prominence and peribronchial cuffing, concerning for probable bronchitis. 2. Cardiomegaly with prominence of the left ventricular contour, suggestive of left ventricular hypertrophy.     Cardiac Studies:  Telemetry 04/05/2023: No significant arrhthymias   EKG 04/04/2023: Sinus tachycardia Severe  diffuse ischemic changes  Cardiac MRI 04/07/2023: 1. Findings most consistent with nonviable myocardium in the LAD distribution. See Findings for description.  There is post contrast delayed myocardial enhancement:   Base: Subendocardial and midmyocardial LGE in the anterior wall and anteroseptum, >50% myocardial thickness. Inferior RV insertion point LGE.   Mid: Basal to mid there is extension of the anterior and anteroseptal LGE, >50% of myocardial thickness. At the true  mid ventricular level there is subendocardial and midmyocardial LGE in the inferoseptum likely >50% of the myocardial thickness.   Apex: Transmural LGE in inferoapex, >50% myocardial thickness.   These findings were reviewed in multiple views and phases post contrast, and seem most consistent with nonviable LAD distribution myocardium.  2. Moderate-severely reduced left ventricular systolic function with normal left ventricular size, LVEF 33%. 3. Normal right ventricular systolic function and chamber size, RVEF 55%.   Coronary angiography 04/04/2025: LM: No distinct left main appreciable with apparent dual ostial to LAD and Lcx LAD: Prox 80%, followed by diffuse mid 90% stenoses Lcx: Large, codominant        Prox 50%, mid and distal 90% stenoses        Small OM1 with ostial 80% stenosis        Lateral branch of OM3 with prox 80% stenosis RCA: Small, codominant         Prox 90% stenosis, followed by mid occlusion, chronic         Left-to-right collaterals from LAD septals to distal RCA   LVEDP 35 mmHg Severe global hypokinesis, LVEF <20%   RHC was performed given severely reduced LVEF and tachycardia   RA: 6 mmHg RV: 40/6 mmHg PA: 41/23 mmHg, mPAP 31 mmHg PCW: 19 mmHg   CO: 6.9 L/min CI: 3.7 L/min/m2   Conclusion: Severe multivessel CAD No one culprit vessel amenable to PCI Severely reduced LVEF, likely ischemic cardiomyopathy Decompensation with cardiogenic shock Mild PH, WHO Grp II  Echocardiogram 04/04/2023:  1. Severe hypokinesis of anterior, anteroseptalm apical wall. Mild  hypokinesis of basal-mid inferolateral wall. . Left ventricular ejection  fraction, by estimation, is 25 to 30%. The left ventricle has severely  decreased function. The left ventricle  demonstrates regional wall motion abnormalities (see scoring  diagram/findings for description). There is mild left ventricular  hypertrophy. Left ventricular diastolic parameters are consistent with  Grade  I diastolic dysfunction (impaired relaxation).  There is severe hypokinesis of the left ventricular, entire anterior wall,  apical segment and anteroseptal wall. There is mild hypokinesis of the  left ventricular, basal-mid inferolateral wall.   2. Right ventricular systolic function is normal. The right ventricular  size is normal.   3. The mitral valve is normal in structure. No evidence of mitral valve  regurgitation. No evidence of mitral stenosis.   4. The aortic valve is normal in structure. Aortic valve regurgitation is  not visualized. No aortic stenosis is present.     Assessment & Recommendations:  67 y.o. African-American female with hypertension, hyperlipidemia, type 2 SM, s/p Rt nephrectomy, admitted with chest pain, EKG concerning for global ischemia  NSTEMI: Severe multivessel CAD. LAD territory non viable. RCA also looks infarcted an not a great target for CTO PCI. Could consider Lcx PCI. Will get heart failure on board.  Continue Aspirin, heparin, statin, IV nitroglycerin  HFrEF: Ischemic cardiomyopathy Diuresing well, net -ve 4.4 L. Cr slightly higher. Patient appears euvolemic. Will hold lasix for now.  Continue spironolactone 25 mg daily, corlanor 5 mg bid today. Can add  beta blocker (Metoprolol succiante 25 or 50 mg daily), and Entresto 24-26 mg bid, post op/post PCI.  Hypertension: Controlled  Type 2 DM: Controlled SSI  Mixed hyperlipidemia: LDL 111. Continue Crestor 20 mg daily.       Elder Negus, MD Pager: [phone] Office: [phone]

## 2023-04-08 NOTE — Consult Note (Addendum)
Advanced Heart Failure Team Consult Note   Primary Physician: Lavonda Jumbo, PA-C PCP-Cardiologist:  None  Reason for Consultation: Heart Failure  HPI:    Patricia Mooney is seen today for evaluation of heart failure  at the request of Dr Joaquim Nam.   Patricia Mooney is a 67 year old with a history of HTN, HLD, DMII, bilateral vein stripping, and R nephrectomy. No previous cardiac history.   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 Had chest pain for several days and put ice on her chest but had ongoing symptoms. Her Mom died from a heart attack several years ago.    Admitted with chest pain. EKG concerning for global ischemia. Pertinent admission labs included: HS Trop 171>139 , lactic acid 1.3, creatinine 1.16, K 3.9, Hgb A1C 6.9. Taken urgently to cath lab and this showed multivessel CAD. CT surgery consulted. Echo showed severe HK anterior/anteroseptum apical wall, EF 25-30% RV normal. CMRI obtained to assess for viability--->nonviable myocardium in LAD, LVEF 33%, and normal RV.   Initially diuresed with IV lasix. Diuretics stopped today with creatinine bump and ortho stasis. Remains on heparin drip. Placed on ivabradine due to tachycardia. No chest pain. Denies shortness of breath.   Cath  RA 6 PA 41/23 (31) PCWP 19 CO 6.9 CI 3.7] LM: No distinct left main appreciable with apparent dual ostial to LAD and Lcx LAD: Prox 80%, followed by diffuse mid 90% stenoses Lcx: Large, codominant        Prox 50%, mid and distal 90% stenoses        Small OM1 with ostial 80% stenosis        Lateral branch of OM3 with prox 80% stenosis RCA: Small, codominant         Prox 90% stenosis, followed by mid occlusion, chronic         Left-to-right collaterals from LAD septals to distal RCA  Review of Systems: [y] = yes, [ ]  = no   General: Weight gain [ ] ; Weight loss [ ] ; Anorexia [ ] ; Fatigue [ ] ; Fever [ ] ; Chills [ ] ; Weakness [ ]   Cardiac: Chest  pain/pressure [ Y]; Resting SOB [ ] ; Exertional SOB [ ] ; Orthopnea [ ] ; Pedal Edema [ ] ; Palpitations [ ] ; Syncope [ ] ; Presyncope [ ] ; Paroxysmal nocturnal dyspnea[ ]   Pulmonary: Cough [ ] ; Wheezing[ ] ; Hemoptysis[ ] ; Sputum [ ] ; Snoring [ ]   GI: Vomiting[ ] ; Dysphagia[ ] ; Melena[ ] ; Hematochezia [ ] ; Heartburn[ ] ; Abdominal pain [ ] ; Constipation [ ] ; Diarrhea [ ] ; BRBPR [ ]   GU: Hematuria[ ] ; Dysuria [ ] ; Nocturia[ ]   Vascular: Pain in legs with walking [ ] ; Pain in feet with lying flat [ ] ; Non-healing sores [ ] ; Stroke [ ] ; TIA [ ] ; Slurred speech [ ] ;  Neuro: Headaches[ ] ; Vertigo[ ] ; Seizures[ ] ; Paresthesias[ ] ;Blurred vision [ ] ; Diplopia [ ] ; Vision changes [ ]   Ortho/Skin: Arthritis [ ] ; Joint pain [Y ]; Muscle pain [ ] ; Joint swelling [ ] ; Back Pain [Y ]; Rash [ ]   Psych: Depression[ ] ; Anxiety[ ]   Heme: Bleeding problems [ ] ; Clotting disorders [ ] ; Anemia [ ]   Endocrine: Diabetes [ Y]; Thyroid dysfunction[ ]   Home Medications Prior to Admission medications   Medication Sig Start Date End Date Taking? Authorizing Provider  acetaminophen (TYLENOL) 500 MG tablet Take 1,000 mg by mouth daily.   Yes [provider]  ASPIRIN LOW DOSE  81 MG tablet Take 81 mg by mouth daily.   Yes [provider]  cholecalciferol (VITAMIN D) 1000 units tablet Take 1,000 Units by mouth daily.   Yes [provider]  diclofenac Sodium (VOLTAREN) 1 % GEL Apply 1 g topically 3 (three) times daily. 03/31/23  Yes [provider]  glipiZIDE (GLUCOTROL) 5 MG tablet Take by mouth daily before breakfast.   Yes [provider]  losartan (COZAAR) 25 MG tablet Take 25 mg by mouth 2 (two) times daily. 10/24/22  Yes [provider]  metFORMIN (GLUCOPHAGE) 1000 MG tablet Take 1 tablet (1,000 mg total) by mouth 2 (two) times daily with a meal. Restart on 10/10/2016 10/09/16  Yes Mikhail, Maryann, DO  OZEMPIC, 0.25 OR 0.5 MG/DOSE, 2 MG/3ML SOPN Inject 0.25 mg into the skin  once a week. Patient uses on Sunday 09/29/22  Yes [provider]  simvastatin (ZOCOR) 20 MG tablet Take 20 mg by mouth daily.   Yes [provider]  vitamin B-12 (CYANOCOBALAMIN) 100 MCG tablet Take 100 mcg by mouth daily.   Yes [provider]    Past Medical History: Past Medical History:  Diagnosis Date   Hypercholesteremia    Hypertension    Periorbital edema of right eye admitted 10/07/2016   Type II diabetes mellitus (HCC)     Past Surgical History: Past Surgical History:  Procedure Laterality Date   ABDOMINAL HYSTERECTOMY  1980   CESAREAN SECTION  1975; 1978   NEPHRECTOMY Right 1980   "said I was born w/one that didn't work"   RIGHT/LEFT HEART CATH AND CORONARY ANGIOGRAPHY N/A 04/04/2023   Procedure: RIGHT/LEFT HEART CATH AND CORONARY ANGIOGRAPHY;  Surgeon: Elder Negus, MD;  Location: [facility];  Service: Cardiovascular;  Laterality: N/A;    Family History: History reviewed. No pertinent family history.  Social History: Social History   Socioeconomic History   Marital status: Married    Spouse name: Not on file   Number of children: Not on file   Years of education: Not on file   Highest education level: Not on file  Occupational History   Not on file  Tobacco Use   Smoking status: Never   Smokeless tobacco: Never  Substance and Sexual Activity   Alcohol use: No   Drug use: No   Sexual activity: Never  Other Topics Concern   Not on file  Social History Narrative   Not on file   Social Determinants of Health   Financial Resource Strain: Not on file  Food Insecurity: No Food Insecurity (04/06/2023)   Hunger Vital Sign    Worried About Running Out of Food in the Last Year: Never true    Ran Out of Food in the Last Year: Never true  Transportation Needs: No Transportation Needs (04/06/2023)   PRAPARE - Administrator, Civil Service (Medical): No    Lack of Transportation (Non-Medical): No  Physical  Activity: Not on file  Stress: Not on file  Social Connections: Not on file    Allergies:  Allergies  Allergen Reactions   Codeine Nausea Only    Objective:    Vital Signs:   Temp:  [97.4 F (36.3 C)-98.7 F (37.1 C)] 98.7 F (37.1 C) (06/19 1124) Pulse Rate:  [72-82] 72 (06/19 1124) Resp:  [13-20] 16 (06/19 1306) BP: (104-131)/(52-68) 128/59 (06/19 1306) SpO2:  [98 %-100 %] 98 % (06/19 1124) Last BM Date : 04/04/23  Weight change: [employer]  04/04/23 0452 04/05/23 0600  Weight: 74.4 kg 75.6 kg    Intake/Output:   Intake/Output Summary (Last 24 hours) at 04/08/2023 1411 Last data filed at 04/08/2023 1200 Gross per 24 hour  Intake 794.7 ml  Output 1000 ml  Net -205.3 ml      Physical Exam    General:  Well appearing. No resp difficulty HEENT: normal Neck: supple. JVP . Carotids 2+ bilat; no bruits. No lymphadenopathy or thyromegaly appreciated. Cor: PMI nondisplaced. Regular rate & rhythm. No rubs, gallops or murmurs. Lungs: clear Abdomen: soft, nontender, nondistended. No hepatosplenomegaly. No bruits or masses. Good bowel sounds. Extremities: no cyanosis, clubbing, rash, edema Neuro: alert & orientedx3, cranial nerves grossly intact. moves all 4 extremities w/o difficulty. Affect pleasant   Telemetry   SR EKG    \  Labs   Basic Metabolic Panel: Recent Labs  Lab 04/04/23 0503 04/04/23 0602 04/04/23 0620 04/04/23 0630 04/04/23 0635 04/05/23 0040  NA 134* 136 133* 137  136 136 133*  K 4.2 4.1 3.9 4.1  4.2 4.2 4.2  CL 101 103 101  --   --  97*  CO2 24  --  22  --   --  23  GLUCOSE 169* 195* 191*  --   --  127*  BUN 26* 23 24*  --   --  20  CREATININE 1.25* 1.10* 1.16*  --   --  1.31*  CALCIUM 9.6  --  9.4  --   --  9.6    Liver Function Tests: Recent Labs  Lab 04/04/23 0503 04/04/23 0620  AST 18 16  ALT 13 13  ALKPHOS 56 55  BILITOT 0.7 0.6  PROT 8.3* 7.2  ALBUMIN 3.8 3.4*   No results for input(s): "LIPASE", "AMYLASE" in  the last 168 hours. No results for input(s): "AMMONIA" in the last 168 hours.  CBC: Recent Labs  Lab 04/04/23 0500 04/04/23 0602 04/04/23 0620 04/04/23 0630 04/04/23 0635 04/05/23 0040 04/06/23 0142 04/07/23 0133 04/08/23 0056  WBC 7.6  --  6.6  --   --  8.6 8.1 7.5 6.3  NEUTROABS 2.7  --   --   --   --   --   --   --   --   HGB 12.0   < > 10.8*   < > 11.6* 12.1 12.8 11.9* 13.2  HCT 37.5   < > 33.2*   < > 34.0* 37.0 40.4 37.4 41.0  MCV 84.7  --  83.0  --   --  82.2 85.2 82.2 84.0  PLT 267  --  243  --   --  275 219 266 256   < > = values in this interval not displayed.    Cardiac Enzymes: No results for input(s): "CKTOTAL", "CKMB", "CKMBINDEX", "TROPONINI" in the last 168 hours.  BNP: BNP (last 3 results) Recent Labs    04/05/23 0040  BNP 690.8*    ProBNP (last 3 results) No results for input(s): "PROBNP" in the last 8760 hours.   CBG: Recent Labs  Lab 04/07/23 1131 04/07/23 1701 04/07/23 2138 04/08/23 0611 04/08/23 1126  GLUCAP 175* 124* 121* 125* 147*    Coagulation Studies: No results for input(s): "LABPROT", "INR" in the last 72 hours.   Imaging   MR CARDIAC MORPHOLOGY W WO CONTRAST  Result Date: 04/07/2023 CLINICAL DATA:  Heart failure, known or suspected, initial workup COMPARISON: Echocardiogram 04/04/23 EXAM: MR CARDIA MORPHOLOGY WITHOUT AND WITH CONTRAST; MR CARDIAC VELOCITY FLOW MAPPING TECHNIQUE:  The patient was scanned on a 1.5 Tesla Siemens magnet. A dedicated cardiac coil was used. Functional imaging was done using TrueFisp sequences. 2,3, and 4 chamber views were done to assess for RWMA's. Modified Simpson's rule using a short axis stack was used to calculate an ejection fraction on a dedicated work [employer]. The patient received 10mL GADAVIST GADOBUTROL 1 MMOL/ML IV SOLN. After 10 minutes inversion recovery sequences were used to assess for infiltration and scar tissue. Phase contrast velocity encoded images obtained x 2.  This examination is tailored for evaluation cardiac anatomy and function and provides very limited assessment of noncardiac structures, which are accordingly not evaluated during interpretation. If there is clinical concern for extracardiac pathology, further evaluation with CT imaging should be considered. FINDINGS: LEFT VENTRICLE: Normal left ventricular chamber size by indexed volume. Moderate basal septal hypertrophy, 15 mm. Moderate-severely reduced left ventricular systolic function. LVEF = 33% There are regional wall motion abnormalities: Base: Anterior wall akinesis, septal hypokinesis. Mid: Akinesis of anterior and anteroseptal wall, hypokinesis of inferoseptum. Apex: Hypokinesis, with akinesis of inferoapex. No myocardial edema, T2 = 49 msec Abnormal first pass perfusion, subendocardial perfusion defect in the septum from apex to base. There is post contrast delayed myocardial enhancement: Base: Subendocardial and midmyocardial LGE in the anterior wall and anteroseptum, >50% myocardial thickness. Inferior RV insertion point LGE. Mid: Basal to mid there is extension of the anterior and anteroseptal LGE, >50% of myocardial thickness. At the true mid ventricular level there is subendocardial and midmyocardial LGE in the inferoseptum likely >50% of the myocardial thickness. Apex: Transmural LGE in inferoapex, >50% myocardial thickness. These findings were reviewed in multiple views and phases post contrast, and seem most consistent with nonviable LAD distribution myocardium. Normal T1 myocardial nulling kinetics suggest against a diagnosis of cardiac amyloidosis. ECV = 35%, mild elevation may indicate hypertensive heart disease. RIGHT VENTRICLE: Normal right ventricular chamber size. Normal right ventricular wall thickness. Normal right ventricular systolic function. RVEF = 55% There are no regional wall motion abnormalities. No post contrast delayed myocardial enhancement. ATRIA: Normal left atrial size.  Normal right atrial size. VALVES: No significant valvular abnormalities.  Mild AR, MR, TR. PERICARDIUM: Normal pericardium.  No pericardial effusion. OTHER: No significant extracardiac findings. MEASUREMENTS: Qp/Qs: 1.1 Aortic valve regurgitation: Mild, regurgitant fraction 11% Pulmonary valve regurgitation: Trivial, regurgitant fraction 6% Mitral valve regurgitation: Mild, regurgitant fraction 13.5% Tricuspid valve regurgitation: Mild, regurgitant fraction 11% Left ventricle: LV female LV EF: 33 % (Normal 52-79%) Absolute volumes: LV EDV: (Normal 78-167 mL) LV ESV: 74mL (Normal 21-64 mL) LV SV: 37mL (Normal 52-114 mL) CO: 3.1L/min (Normal 2.7-6.3 L/min) Indexed volumes: LV EDV: 43mL/sq-m (Normal 50-96 mL/sq-m) LV ESV: 55mL/sq-m (normal 10-40 mL/sq-m) LV SV: 26mL/sq-m (Normal 33-64 mL/sq-m) CI: 1.64L/min/sq-m (Normal 1.9-3.9 L/min/sq-m) Right ventricle: RV female RV EF: 55% (normal 52-80%) Absolute volumes: RV EDV: 67mL (Normal 79-175 mL) RV ESV: 30mL (Normal 13-75 mL) RV SV: 37mL (Normal 56-110 mL) CO: 3.15L/min (Normal 2.7-6 L/min) Indexed volumes: RV EDV: 52mL/sq-m (Normal 51-97 mL/sq-m) RV ESV: 65mL/sq-m (Normal 9-42 mL/sq-m) RV SV: 59mL/sq-m (Normal 35-61 mL/sq-m) CI: 1.66L/min/sq-m (Normal 1.8-3.8 L/min/sq-m) IMPRESSION: 1. Findings most consistent with nonviable myocardium in the LAD distribution. See Findings for description. 2. Moderate-severely reduced left ventricular systolic function with normal left ventricular size, LVEF 33%. 3. Normal right ventricular systolic function and chamber size, RVEF 55%. Electronically Signed   By: Weston Brass M.D.   On: 04/07/2023 21:23   MR CARDIAC VELOCITY FLOW MAP  Result  Date: 04/07/2023 CLINICAL DATA:  Heart failure, known or suspected, initial workup COMPARISON: Echocardiogram 04/04/23 EXAM: MR CARDIA MORPHOLOGY WITHOUT AND WITH CONTRAST; MR CARDIAC VELOCITY FLOW MAPPING TECHNIQUE: The patient was scanned on a 1.5 Tesla Siemens magnet. A dedicated  cardiac coil was used. Functional imaging was done using TrueFisp sequences. 2,3, and 4 chamber views were done to assess for RWMA's. Modified Simpson's rule using a short axis stack was used to calculate an ejection fraction on a dedicated work [employer] officer, [employer]. The patient received 10mL GADAVIST GADOBUTROL 1 MMOL/ML IV SOLN. After 10 minutes inversion recovery sequences were used to assess for infiltration and scar tissue. Phase contrast velocity encoded images obtained x 2. This examination is tailored for evaluation cardiac anatomy and function and provides very limited assessment of noncardiac structures, which are accordingly not evaluated during interpretation. If there is clinical concern for extracardiac pathology, further evaluation with CT imaging should be considered. FINDINGS: LEFT VENTRICLE: Normal left ventricular chamber size by indexed volume. Moderate basal septal hypertrophy, 15 mm. Moderate-severely reduced left ventricular systolic function. LVEF = 33% There are regional wall motion abnormalities: Base: Anterior wall akinesis, septal hypokinesis. Mid: Akinesis of anterior and anteroseptal wall, hypokinesis of inferoseptum. Apex: Hypokinesis, with akinesis of inferoapex. No myocardial edema, T2 = 49 msec Abnormal first pass perfusion, subendocardial perfusion defect in the septum from apex to base. There is post contrast delayed myocardial enhancement: Base: Subendocardial and midmyocardial LGE in the anterior wall and anteroseptum, >50% myocardial thickness. Inferior RV insertion point LGE. Mid: Basal to mid there is extension of the anterior and anteroseptal LGE, >50% of myocardial thickness. At the true mid ventricular level there is subendocardial and midmyocardial LGE in the inferoseptum likely >50% of the myocardial thickness. Apex: Transmural LGE in inferoapex, >50% myocardial thickness. These findings were reviewed in multiple views and phases post contrast, and seem most  consistent with nonviable LAD distribution myocardium. Normal T1 myocardial nulling kinetics suggest against a diagnosis of cardiac amyloidosis. ECV = 35%, mild elevation may indicate hypertensive heart disease. RIGHT VENTRICLE: Normal right ventricular chamber size. Normal right ventricular wall thickness. Normal right ventricular systolic function. RVEF = 55% There are no regional wall motion abnormalities. No post contrast delayed myocardial enhancement. ATRIA: Normal left atrial size. Normal right atrial size. VALVES: No significant valvular abnormalities.  Mild AR, MR, TR. PERICARDIUM: Normal pericardium.  No pericardial effusion. OTHER: No significant extracardiac findings. MEASUREMENTS: Qp/Qs: 1.1 Aortic valve regurgitation: Mild, regurgitant fraction 11% Pulmonary valve regurgitation: Trivial, regurgitant fraction 6% Mitral valve regurgitation: Mild, regurgitant fraction 13.5% Tricuspid valve regurgitation: Mild, regurgitant fraction 11% Left ventricle: LV female LV EF: 33 % (Normal 52-79%) Absolute volumes: LV EDV: (Normal 78-167 mL) LV ESV: 74mL (Normal 21-64 mL) LV SV: 37mL (Normal 52-114 mL) CO: 3.1L/min (Normal 2.7-6.3 L/min) Indexed volumes: LV EDV: 66mL/sq-m (Normal 50-96 mL/sq-m) LV ESV: 4mL/sq-m (normal 10-40 mL/sq-m) LV SV: 70mL/sq-m (Normal 33-64 mL/sq-m) CI: 1.64L/min/sq-m (Normal 1.9-3.9 L/min/sq-m) Right ventricle: RV female RV EF: 55% (normal 52-80%) Absolute volumes: RV EDV: 67mL (Normal 79-175 mL) RV ESV: 30mL (Normal 13-75 mL) RV SV: 37mL (Normal 56-110 mL) CO: 3.15L/min (Normal 2.7-6 L/min) Indexed volumes: RV EDV: 6mL/sq-m (Normal 51-97 mL/sq-m) RV ESV: 63mL/sq-m (Normal 9-42 mL/sq-m) RV SV: 3mL/sq-m (Normal 35-61 mL/sq-m) CI: 1.66L/min/sq-m (Normal 1.8-3.8 L/min/sq-m) IMPRESSION: 1. Findings most consistent with nonviable myocardium in the LAD distribution. See Findings for description. 2. Moderate-severely reduced left ventricular systolic function with normal left ventricular  size, LVEF 33%. 3. Normal right ventricular  systolic function and chamber size, RVEF 55%. Electronically Signed   By: Weston Brass M.D.   On: 04/07/2023 21:23   MR CARDIAC VELOCITY FLOW MAP  Result Date: 04/07/2023 CLINICAL DATA:  Heart failure, known or suspected, initial workup COMPARISON: Echocardiogram 04/04/23 EXAM: MR CARDIA MORPHOLOGY WITHOUT AND WITH CONTRAST; MR CARDIAC VELOCITY FLOW MAPPING TECHNIQUE: The patient was scanned on a 1.5 Tesla Siemens magnet. A dedicated cardiac coil was used. Functional imaging was done using TrueFisp sequences. 2,3, and 4 chamber views were done to assess for RWMA's. Modified Simpson's rule using a short axis stack was used to calculate an ejection fraction on a dedicated work [employer], [employer]. The patient received 10mL GADAVIST GADOBUTROL 1 MMOL/ML IV SOLN. After 10 minutes inversion recovery sequences were used to assess for infiltration and scar tissue. Phase contrast velocity encoded images obtained x 2. This examination is tailored for evaluation cardiac anatomy and function and provides very limited assessment of noncardiac structures, which are accordingly not evaluated during interpretation. If there is clinical concern for extracardiac pathology, further evaluation with CT imaging should be considered. FINDINGS: LEFT VENTRICLE: Normal left ventricular chamber size by indexed volume. Moderate basal septal hypertrophy, 15 mm. Moderate-severely reduced left ventricular systolic function. LVEF = 33% There are regional wall motion abnormalities: Base: Anterior wall akinesis, septal hypokinesis. Mid: Akinesis of anterior and anteroseptal wall, hypokinesis of inferoseptum. Apex: Hypokinesis, with akinesis of inferoapex. No myocardial edema, T2 = 49 msec Abnormal first pass perfusion, subendocardial perfusion defect in the septum from apex to base. There is post contrast delayed myocardial enhancement: Base: Subendocardial and midmyocardial LGE in the  anterior wall and anteroseptum, >50% myocardial thickness. Inferior RV insertion point LGE. Mid: Basal to mid there is extension of the anterior and anteroseptal LGE, >50% of myocardial thickness. At the true mid ventricular level there is subendocardial and midmyocardial LGE in the inferoseptum likely >50% of the myocardial thickness. Apex: Transmural LGE in inferoapex, >50% myocardial thickness. These findings were reviewed in multiple views and phases post contrast, and seem most consistent with nonviable LAD distribution myocardium. Normal T1 myocardial nulling kinetics suggest against a diagnosis of cardiac amyloidosis. ECV = 35%, mild elevation may indicate hypertensive heart disease. RIGHT VENTRICLE: Normal right ventricular chamber size. Normal right ventricular wall thickness. Normal right ventricular systolic function. RVEF = 55% There are no regional wall motion abnormalities. No post contrast delayed myocardial enhancement. ATRIA: Normal left atrial size. Normal right atrial size. VALVES: No significant valvular abnormalities.  Mild AR, MR, TR. PERICARDIUM: Normal pericardium.  No pericardial effusion. OTHER: No significant extracardiac findings. MEASUREMENTS: Qp/Qs: 1.1 Aortic valve regurgitation: Mild, regurgitant fraction 11% Pulmonary valve regurgitation: Trivial, regurgitant fraction 6% Mitral valve regurgitation: Mild, regurgitant fraction 13.5% Tricuspid valve regurgitation: Mild, regurgitant fraction 11% Left ventricle: LV female LV EF: 33 % (Normal 52-79%) Absolute volumes: LV EDV: (Normal 78-167 mL) LV ESV: 74mL (Normal 21-64 mL) LV SV: 37mL (Normal 52-114 mL) CO: 3.1L/min (Normal 2.7-6.3 L/min) Indexed volumes: LV EDV: 22mL/sq-m (Normal 50-96 mL/sq-m) LV ESV: 71mL/sq-m (normal 10-40 mL/sq-m) LV SV: 78mL/sq-m (Normal 33-64 mL/sq-m) CI: 1.64L/min/sq-m (Normal 1.9-3.9 L/min/sq-m) Right ventricle: RV female RV EF: 55% (normal 52-80%) Absolute volumes: RV EDV: 67mL (Normal 79-175 mL) RV ESV:  30mL (Normal 13-75 mL) RV SV: 37mL (Normal 56-110 mL) CO: 3.15L/min (Normal 2.7-6 L/min) Indexed volumes: RV EDV: 38mL/sq-m (Normal 51-97 mL/sq-m) RV ESV: 42mL/sq-m (Normal 9-42 mL/sq-m) RV SV: 29mL/sq-m (Normal 35-61 mL/sq-m) CI: 1.66L/min/sq-m (Normal 1.8-3.8 L/min/sq-m) IMPRESSION: 1. Findings most consistent  with nonviable myocardium in the LAD distribution. See Findings for description. 2. Moderate-severely reduced left ventricular systolic function with normal left ventricular size, LVEF 33%. 3. Normal right ventricular systolic function and chamber size, RVEF 55%. Electronically Signed   By: Weston Brass M.D.   On: 04/07/2023 21:23   VAS [place] ABI WITH/WO TBI  Result Date: 04/07/2023  LOWER EXTREMITY DOPPLER STUDY Patient Name:  Patricia Mooney  Date of Exam:   04/07/2023 Medical Rec #: [id number]       Accession #:    [id number] Date of Birth: 03-31-1956       Patient Gender: F Patient Age:   59 years Exam Location:  [facility] Procedure:      VAS [place] ABI WITH/WO TBI Referring Phys: HARRELL LIGHTFOOT --------------------------------------------------------------------------------  Indications: Preop for CABG High Risk         Hypertension, hyperlipidemia, Diabetes, coronary artery Factors:          disease.  Performing Technologist: Marilynne Halsted RDMS, RVT  Examination Guidelines: A complete evaluation includes at minimum, Doppler waveform signals and systolic blood pressure reading at the level of bilateral brachial, anterior tibial, and posterior tibial arteries, when vessel segments are accessible. Bilateral testing is considered an integral part of a complete examination. Photoelectric Plethysmograph (PPG) waveforms and toe systolic pressure readings are included as required and additional duplex testing as needed. Limited examinations for reoccurring indications may be performed as noted.  ABI Findings: +---------+------------------+-----+----------+----------------------+ Right    Rt  Pressure (mmHg)IndexWaveform  Comment                +---------+------------------+-----+----------+----------------------+ Brachial 135                    triphasic                        +---------+------------------+-----+----------+----------------------+ PTA      66                0.49 monophasic                       +---------+------------------+-----+----------+----------------------+ DP       57                0.42 monophasic                       +---------+------------------+-----+----------+----------------------+ Great Toe                       Absent    too flatened to obtain +---------+------------------+-----+----------+----------------------+ +---------+------------------+-----+----------+----------------------+ Left     Lt Pressure (mmHg)IndexWaveform  Comment                +---------+------------------+-----+----------+----------------------+ Brachial                        triphasic IV in place            +---------+------------------+-----+----------+----------------------+ PTA      81                0.60 monophasic                       +---------+------------------+-----+----------+----------------------+ DP       82                0.61 monophasic                       +---------+------------------+-----+----------+----------------------+  Great Toe                       Absent    too flatened to obtain +---------+------------------+-----+----------+----------------------+ +-------+-----------+-----------+------------+------------+ ABI/TBIToday's ABIToday's TBIPrevious ABIPrevious TBI +-------+-----------+-----------+------------+------------+ Right  0.49                                           +-------+-----------+-----------+------------+------------+ Left   0.61                                           +-------+-----------+-----------+------------+------------+  Summary: Right: Resting right ankle-brachial index  indicates severe right lower extremity arterial disease. Left: Resting left ankle-brachial index indicates moderate left lower extremity arterial disease. *See table(s) above for measurements and observations.  Electronically signed by Waverly Ferrari MD on 04/07/2023 at 6:17:02 PM.    Final      Medications:     Current Medications:  aspirin EC  81 mg Oral Daily   insulin aspart  0-15 Units Subcutaneous TID WC   insulin aspart  0-5 Units Subcutaneous QHS   insulin aspart  4 Units Subcutaneous TID WC   ivabradine  5 mg Oral BID WC   rosuvastatin  20 mg Oral Daily   sodium chloride flush  3 mL Intravenous Q12H    Infusions:  sodium chloride Stopped (04/05/23 1700)   sodium chloride     sodium chloride     heparin 900 Units/hr (04/07/23 2104)   nitroGLYCERIN Stopped (04/04/23 1948)      Patient Profile   Patricia Mooney is a 67 year old with a history of HTN, HLD, DMII, and R nephrectomy. No previous cardiac history.   Cath with multivessel disease. CT surgery consulted.  Assessment/Plan   1. Chest Pain  LHC with multivessel CAD.  CT surgery following. CMRI with nonviable LAD.  Revascularization options also limited by lower extremity vein stripping. -Dr Gala Romney discussing with CT surgery and Dr Joaquim Nam.  -Remains on heparin drip.  - On Statin.    2. Acute HFrEF, ICM Echo EF 25-30%. Cath RA 5, PCWP 19, CO 6.9 CI 1.7.  CMRI LVEF 33%. RV normal.  Diuresed post cath. Now with orthostasis. Suspect over diuresed. Diuretics/SGLT2i stopped.   Getting IV  fluid back.  -Hold off on bb for now - Hold off on ARB/MRA. Needs stat BMET>   3. DMII Hgb A1C 6.9  On SSI  4. 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    5. CKD Stage III Creatinine 1.3 a few days ago. Needs BMET now, ordered.    BMET results. Creatinine trending 1.16>1.3>1.77.    Length of Stay: 4  Amy Clegg, NP  04/08/2023, 2:11 PM  Advanced Heart Failure Team Pager [phone] (M-F; 7a - 5p)   Please contact [facility] for night-coverage after hours (4p -7a ) and weekends on [email or web address]  Patient seen and examined with the above-signed Advanced Practice Provider and/or Housestaff. I personally reviewed laboratory data, imaging studies and relevant notes. I independently examined the patient and formulated the important aspects of the plan. I have edited the note to reflect any of my changes or salient points. I have personally discussed the plan with the patient and/or family.  67 year old woman with HTN, HL, DM2, bilateral  vein stripping, and R nephrectomy. No previous cardiac history. Admitted with likely late-presenting MI.   Cath with co-dominant system with separate ostia for LAD/LCx.  LAD 95% prox. LCX multiple 60-90% lesions. RCA occluded EF 30-35% Seen by TCTS. cMRI ordered. EF 31% nonviable LAD territory   Denies further CP. Scr 1.2 -> 1.77  General:  Sitting up in bed  No resp difficulty HEENT: normal Neck: supple. no JVD. Carotids 2+ bilat; no bruits. No lymphadenopathy or thryomegaly appreciated. Cor: PMI nondisplaced. Regular rate & rhythm. No rubs, gallops or murmurs. Lungs: clear Abdomen: soft, nontender, nondistended. No hepatosplenomegaly. No bruits or masses. Good bowel sounds. Extremities: no cyanosis, clubbing, rash, edema Neuro: alert & orientedx3, cranial nerves grossly intact. moves all 4 extremities w/o difficulty. Affect pleasant  Given nonviable LAD territory do not think she will benefit from CABG. Agree with plan for aggressive medical therapy +/- percutaneous revascularization of LCX territory per Dr. Rosemary Holms. Would titrate GDMT after cath.   Arvilla Meres, MD  6:00 PM

## 2023-04-08 NOTE — Progress Notes (Signed)
Mobility Specialist Progress Note:    04/08/23 1249  Therapy Vitals  Patient Position (if appropriate) Orthostatic Vitals  Orthostatic Lying   BP- Lying 103/87  Orthostatic Sitting  BP- Sitting 119/64  Orthostatic Standing at 0 minutes  BP- Standing at 0 minutes (!) 79/46  Mobility  Activity Stood at bedside  Level of Assistance Contact guard assist, steadying assist  Assistive Device Front wheel walker  Activity Response Tolerated well  Mobility Referral Yes  $Mobility charge 1 Mobility  Mobility Specialist Start Time (ACUTE ONLY) 1233  Mobility Specialist Stop Time (ACUTE ONLY) 1248  Mobility Specialist Time Calculation (min) (ACUTE ONLY) 15 min   Pt received in bed agreeable to mobilize. While taking standing BP pt c/o string dizziness, needing to take a seated break. D/t the drop in BP (see above) and pt dizziness, session was cut short. Pt assisted back in bed w/ call bell and personal belongings in reach.  Thompson Grayer Mobility Specialist  Please contact vis Secure Chat or  Rehab Office [phone]

## 2023-04-09 ENCOUNTER — Inpatient Hospital Stay (HOSPITAL_COMMUNITY)
Admission: EM | Disposition: A | Discharge status: Home / Self Care | Payer: Self-pay | Source: Home / Self Care | Attending: Cardiology

## 2023-04-09 HISTORY — PX: CORONARY STENT INTERVENTION: CATH118234

## 2023-04-09 HISTORY — PX: CORONARY ULTRASOUND/IVUS: CATH118244

## 2023-04-09 LAB — CBC
HCT: 36.8 % (ref 36.0–46.0)
HCT: 39.3 % (ref 36.0–46.0)
Hemoglobin: 11.8 g/dL — ABNORMAL LOW (ref 12.0–15.0)
Hemoglobin: 12.4 g/dL (ref 12.0–15.0)
MCH: 26.3 pg (ref 26.0–34.0)
MCH: 27 pg (ref 26.0–34.0)
MCHC: 32.1 g/dL (ref 30.0–36.0)
MCHC: 31.6 g/dL (ref 30.0–36.0)
MCV: 82 fL (ref 80.0–100.0)
MCV: 85.4 fL (ref 80.0–100.0)
Platelets: 224 10*3/uL (ref 150–400)
Platelets: 228 10*3/uL (ref 150–400)
RBC: 4.49 MIL/uL (ref 3.87–5.11)
RBC: 4.6 MIL/uL (ref 3.87–5.11)
RDW: 13.3 % (ref 11.5–15.5)
RDW: 13.3 % (ref 11.5–15.5)
WBC: 7.1 10*3/uL (ref 4.0–10.5)
WBC: 6.6 10*3/uL (ref 4.0–10.5)
nRBC: 0 % (ref 0.0–0.2)
nRBC: 0 % (ref 0.0–0.2)

## 2023-04-09 LAB — GLUCOSE, CAPILLARY
Glucose-Capillary: 137 mg/dL — ABNORMAL HIGH (ref 70–99)
Glucose-Capillary: 122 mg/dL — ABNORMAL HIGH (ref 70–99)
Glucose-Capillary: 112 mg/dL — ABNORMAL HIGH (ref 70–99)
Glucose-Capillary: 163 mg/dL — ABNORMAL HIGH (ref 70–99)

## 2023-04-09 LAB — POCT ACTIVATED CLOTTING TIME
Activated Clotting Time: 281 seconds
Activated Clotting Time: 348 seconds
Activated Clotting Time: 855 seconds

## 2023-04-09 LAB — BASIC METABOLIC PANEL
Anion gap: 11 (ref 5–15)
Anion gap: 9 (ref 5–15)
BUN: 37 mg/dL — ABNORMAL HIGH (ref 8–23)
BUN: 33 mg/dL — ABNORMAL HIGH (ref 8–23)
CO2: 22 mmol/L (ref 22–32)
CO2: 21 mmol/L — ABNORMAL LOW (ref 22–32)
Calcium: 9.5 mg/dL (ref 8.9–10.3)
Calcium: 9.4 mg/dL (ref 8.9–10.3)
Chloride: 102 mmol/L (ref 98–111)
Chloride: 105 mmol/L (ref 98–111)
Creatinine, Ser: 1.6 mg/dL — ABNORMAL HIGH (ref 0.44–1.00)
Creatinine, Ser: 1.46 mg/dL — ABNORMAL HIGH (ref 0.44–1.00)
GFR, Estimated: 35 mL/min — ABNORMAL LOW (ref >60)
GFR, Estimated: 39 mL/min — ABNORMAL LOW (ref >60)
Glucose, Bld: 144 mg/dL — ABNORMAL HIGH (ref 70–99)
Glucose, Bld: 128 mg/dL — ABNORMAL HIGH (ref 70–99)
Potassium: 4.1 mmol/L (ref 3.5–5.1)
Potassium: 4 mmol/L (ref 3.5–5.1)
Sodium: 135 mmol/L (ref 135–145)
Sodium: 135 mmol/L (ref 135–145)

## 2023-04-09 LAB — CREATININE, SERUM
Creatinine, Ser: 1.43 mg/dL — ABNORMAL HIGH (ref 0.44–1.00)
GFR, Estimated: 40 mL/min — ABNORMAL LOW (ref >60)

## 2023-04-09 LAB — HEPARIN LEVEL (UNFRACTIONATED): Heparin Unfractionated: 0.54 IU/mL (ref 0.30–0.70)

## 2023-04-09 SURGERY — CORONARY STENT INTERVENTION
Anesthesia: LOCAL

## 2023-04-09 MED ORDER — MIDAZOLAM HCL 2 MG/2ML IJ SOLN
INTRAMUSCULAR | Status: DC | PRN
  Administered 2023-04-09: 1 mg via INTRAVENOUS

## 2023-04-09 MED ORDER — SODIUM CHLORIDE 0.9 % IV SOLN: INTRAVENOUS | Status: DC

## 2023-04-09 MED ORDER — SODIUM CHLORIDE 0.9% FLUSH
3.0000 mL | Freq: Two times a day (BID) | INTRAVENOUS | Status: DC
  Administered 2023-04-10: 3 mL via INTRAVENOUS

## 2023-04-09 MED ORDER — SODIUM CHLORIDE 0.9% FLUSH: 3.0000 mL | Freq: Two times a day (BID) | INTRAVENOUS | Status: DC

## 2023-04-09 MED ORDER — VERAPAMIL HCL 2.5 MG/ML IV SOLN
INTRAVENOUS | Status: DC | PRN
  Administered 2023-04-09: 10 mL via INTRA_ARTERIAL

## 2023-04-09 MED ORDER — ACETAMINOPHEN 325 MG PO TABS: 650.0000 mg | ORAL_TABLET | ORAL | Status: DC | PRN

## 2023-04-09 MED ORDER — SODIUM CHLORIDE 0.9% FLUSH: 3.0000 mL | INTRAVENOUS | Status: DC | PRN

## 2023-04-09 MED ORDER — ASPIRIN 81 MG PO CHEW: 81.0000 mg | CHEWABLE_TABLET | ORAL | Status: DC

## 2023-04-09 MED ORDER — LIDOCAINE HCL (PF) 1 % IJ SOLN
INTRAMUSCULAR | Status: DC | PRN
  Administered 2023-04-09: 5 mL via INTRADERMAL

## 2023-04-09 MED ORDER — HEPARIN SODIUM (PORCINE) 1000 UNIT/ML IJ SOLN
INTRAMUSCULAR | Status: DC | PRN
  Administered 2023-04-09: 2000 [IU] via INTRAVENOUS
  Administered 2023-04-09: 7000 [IU] via INTRAVENOUS

## 2023-04-09 MED ORDER — NOREPINEPHRINE BITARTRATE 1 MG/ML IV SOLN
INTRAVENOUS | Status: DC | PRN
  Administered 2023-04-09: 5 ug/min via INTRAVENOUS

## 2023-04-09 MED ORDER — FENTANYL CITRATE (PF) 100 MCG/2ML IJ SOLN
INTRAMUSCULAR | Status: AC
  Filled 2023-04-09: qty 2

## 2023-04-09 MED ORDER — HEPARIN (PORCINE) IN NACL 1000-0.9 UT/500ML-% IV SOLN
INTRAVENOUS | Status: DC | PRN
  Administered 2023-04-09 (×2): 500 mL

## 2023-04-09 MED ORDER — NITROGLYCERIN 1 MG/10 ML FOR IR/CATH LAB
INTRA_ARTERIAL | Status: DC | PRN
  Administered 2023-04-09: 200 ug via INTRACORONARY
  Administered 2023-04-09: 100 ug via INTRACORONARY
  Administered 2023-04-09: 200 ug via INTRACORONARY
  Administered 2023-04-09: 100 ug via INTRACORONARY
  Administered 2023-04-09: 200 ug via INTRACORONARY
  Administered 2023-04-09: 100 ug via INTRACORONARY
  Administered 2023-04-09: 200 ug via INTRACORONARY

## 2023-04-09 MED ORDER — NITROGLYCERIN 1 MG/10 ML FOR IR/CATH LAB
INTRA_ARTERIAL | Status: AC
  Filled 2023-04-09: qty 10

## 2023-04-09 MED ORDER — MIDAZOLAM HCL 2 MG/2ML IJ SOLN
INTRAMUSCULAR | Status: AC
  Filled 2023-04-09: qty 2

## 2023-04-09 MED ORDER — IOHEXOL 350 MG/ML SOLN
INTRAVENOUS | Status: DC | PRN
  Administered 2023-04-09: 85 mL

## 2023-04-09 MED ORDER — HYDRALAZINE HCL 20 MG/ML IJ SOLN: 10.0000 mg | INTRAMUSCULAR | Status: AC | PRN

## 2023-04-09 MED ORDER — SODIUM CHLORIDE 0.9 % IV SOLN: 250.0000 mL | INTRAVENOUS | Status: DC | PRN

## 2023-04-09 MED ORDER — LIDOCAINE HCL (PF) 1 % IJ SOLN
INTRAMUSCULAR | Status: AC
  Filled 2023-04-09: qty 30

## 2023-04-09 MED ORDER — HEPARIN SODIUM (PORCINE) 5000 UNIT/ML IJ SOLN
5000.0000 [IU] | Freq: Three times a day (TID) | INTRAMUSCULAR | Status: DC
  Administered 2023-04-09 – 2023-04-10 (×2): 5000 [IU] via SUBCUTANEOUS
  Filled 2023-04-09 (×2): qty 1

## 2023-04-09 MED ORDER — SODIUM CHLORIDE 0.9 % IV SOLN
INTRAVENOUS | Status: AC | PRN
  Administered 2023-04-09: 500 mL via INTRAVENOUS

## 2023-04-09 MED ORDER — LABETALOL HCL 5 MG/ML IV SOLN: 10.0000 mg | INTRAVENOUS | Status: AC | PRN

## 2023-04-09 MED ORDER — SODIUM CHLORIDE 0.9 % IV SOLN: INTRAVENOUS | Status: AC

## 2023-04-09 MED ORDER — NOREPINEPHRINE 4 MG/250ML-% IV SOLN
INTRAVENOUS | Status: AC
  Filled 2023-04-09: qty 250

## 2023-04-09 MED ORDER — ONDANSETRON HCL 4 MG/2ML IJ SOLN: 4.0000 mg | Freq: Four times a day (QID) | INTRAMUSCULAR | Status: DC | PRN

## 2023-04-09 MED ORDER — FENTANYL CITRATE (PF) 100 MCG/2ML IJ SOLN
INTRAMUSCULAR | Status: DC | PRN
  Administered 2023-04-09: 25 ug via INTRAVENOUS

## 2023-04-09 SURGICAL SUPPLY — 32 items
BALL SAPPHIRE NC24 2.0X8 (BALLOONS) ×1
BALL SAPPHIRE NC24 2.50X8 (BALLOONS) ×1
BALL SAPPHIRE NC24 2.75X12 (BALLOONS) ×1
BALLN EMERGE MR 2.0X12 (BALLOONS) ×1
BALLN EMERGE MR 2.0X15 (BALLOONS) ×1
BALLN ~~LOC~~ EMERGE MR 2.5X8 (BALLOONS) ×1
BALLN ~~LOC~~ EMERGE MR 2.75X12 (BALLOONS) ×1
BALLOON EMERGE MR 2.0X12 (BALLOONS) IMPLANT
BALLOON EMERGE MR 2.0X15 (BALLOONS) IMPLANT
BALLOON SAPPHIRE NC24 2.0X8 (BALLOONS) IMPLANT
BALLOON SAPPHIRE NC24 2.50X8 (BALLOONS) IMPLANT
BALLOON SAPPHIRE NC24 2.75X12 (BALLOONS) IMPLANT
BALLOON ~~LOC~~ EMERGE MR 2.5X8 (BALLOONS) IMPLANT
BALLOON ~~LOC~~ EMERGE MR 2.75X12 (BALLOONS) IMPLANT
CATH INFINITI JR4 5F (CATHETERS) IMPLANT
CATH LAUNCHER 6FR EBU3.5 (CATHETERS) IMPLANT
CATH OPTICROSS HD (CATHETERS) IMPLANT
DEVICE RAD COMP TR BAND LRG (VASCULAR PRODUCTS) IMPLANT
GLIDESHEATH SLEND A-KIT 6F 22G (SHEATH) IMPLANT
GLIDESHEATH SLEND SS 6F .021 (SHEATH) IMPLANT
GUIDEWIRE INQWIRE 1.5J.035X260 (WIRE) IMPLANT
INQWIRE 1.5J .035X260CM (WIRE) ×1
KIT ENCORE 26 ADVANTAGE (KITS) IMPLANT
KIT HEART LEFT (KITS) ×1 IMPLANT
KIT HEMO VALVE WATCHDOG (MISCELLANEOUS) IMPLANT
PACK CARDIAC CATHETERIZATION (CUSTOM PROCEDURE TRAY) ×1 IMPLANT
SLED PULL BACK IVUS (MISCELLANEOUS) IMPLANT
STENT ONYX FRONTIER 2.0X15 (Permanent Stent) IMPLANT
STENT ONYX FRONTIER 2.5X15 (Permanent Stent) IMPLANT
TRANSDUCER W/STOPCOCK (MISCELLANEOUS) ×1 IMPLANT
TUBING CIL FLEX 10 FLL-RA (TUBING) ×1 IMPLANT
WIRE ASAHI PROWATER 180CM (WIRE) IMPLANT

## 2023-04-09 NOTE — Progress Notes (Signed)
Cr down to 1/46. Will proceed with Lcx/OM PCI today.   Elder Negus, MD Pager: [phone] Office: [phone]

## 2023-04-09 NOTE — Progress Notes (Signed)
ANTICOAGULATION CONSULT NOTE   Pharmacy Consult for heparin Indication: chest pain/ACS  Allergies  Allergen Reactions   Codeine Nausea Only    Patient Measurements: Height: 5' 7.5" (171.5 cm) Weight: 70.8 kg (156 lb 1.6 oz) IBW/kg (Calculated) : 62.75 Heparin Dosing Weight: 74.4kg  Vital Signs: Temp: 98.7 F (37.1 C) (06/20 1140) Temp Source: Oral (06/20 1140) BP: 139/72 (06/20 1140) Pulse Rate: 73 (06/20 1140)  Labs: Recent Labs    04/07/23 0133 04/08/23 0056 04/08/23 1445 04/08/23 2107 04/09/23 0127 04/09/23 0826  HGB 11.9* 13.2  --   --  11.8*  --   HCT 37.4 41.0  --   --  36.8  --   PLT 266 256  --   --  224  --   HEPARINUNFRC 0.51 0.57  --   --  0.54  --   CREATININE  --   --    < > 1.74* 1.60* 1.46*   < > = values in this interval not displayed.     Estimated Creatinine Clearance: 36.1 mL/min (A) (by C-G formula based on SCr of 1.46 mg/dL (H)).   Medical History: Past Medical History:  Diagnosis Date   Hypercholesteremia    Hypertension    Periorbital edema of right eye admitted 10/07/2016   Type II diabetes mellitus [facility])    Assessment: 67 yo female reports chest pian for over a week. Endorses high BP at home.  Pharmacy consulted to dose heparin for ACS.  No prior AC noted  Heparin level remains therapeutic at 0.54, on 900 units/hr. Hgb 11.8, plt 224. No s/sx of bleeding or infusion issues noted.   Goal of Therapy:  Heparin level 0.3-0.7 units/ml Monitor platelets by anticoagulation protocol: Yes   Plan:  Continue IV heparin at 900 units/hr Daily CBC, heparin level.  Thank you for allowing pharmacy to participate in this patient's care,  Sherron Monday, PharmD, BCCCP Clinical Pharmacist  Phone: [phone] 04/09/2023 11:49 AM  Please check AMION for all [facility] phone numbers After 10:00 PM, call Main Pharmacy [phone]

## 2023-04-10 ENCOUNTER — Other Ambulatory Visit (HOSPITAL_COMMUNITY): Payer: Self-pay

## 2023-04-10 ENCOUNTER — Encounter (HOSPITAL_COMMUNITY): Payer: Self-pay | Admitting: Cardiology

## 2023-04-10 LAB — BASIC METABOLIC PANEL
Anion gap: 10 (ref 5–15)
BUN: 25 mg/dL — ABNORMAL HIGH (ref 8–23)
CO2: 20 mmol/L — ABNORMAL LOW (ref 22–32)
Calcium: 9 mg/dL (ref 8.9–10.3)
Chloride: 106 mmol/L (ref 98–111)
Creatinine, Ser: 1.52 mg/dL — ABNORMAL HIGH (ref 0.44–1.00)
GFR, Estimated: 37 mL/min — ABNORMAL LOW (ref >60)
Glucose, Bld: 109 mg/dL — ABNORMAL HIGH (ref 70–99)
Potassium: 4.1 mmol/L (ref 3.5–5.1)
Sodium: 136 mmol/L (ref 135–145)

## 2023-04-10 LAB — CBC
HCT: 35.5 % — ABNORMAL LOW (ref 36.0–46.0)
Hemoglobin: 11.3 g/dL — ABNORMAL LOW (ref 12.0–15.0)
MCH: 26.4 pg (ref 26.0–34.0)
MCHC: 31.8 g/dL (ref 30.0–36.0)
MCV: 82.9 fL (ref 80.0–100.0)
Platelets: 216 10*3/uL (ref 150–400)
RBC: 4.28 MIL/uL (ref 3.87–5.11)
RDW: 13.3 % (ref 11.5–15.5)
WBC: 5.6 10*3/uL (ref 4.0–10.5)
nRBC: 0 % (ref 0.0–0.2)

## 2023-04-10 LAB — HEPARIN LEVEL (UNFRACTIONATED): Heparin Unfractionated: <0.1 IU/mL — ABNORMAL LOW (ref 0.30–0.70)

## 2023-04-10 LAB — GLUCOSE, CAPILLARY
Glucose-Capillary: 182 mg/dL — ABNORMAL HIGH (ref 70–99)
Glucose-Capillary: 129 mg/dL — ABNORMAL HIGH (ref 70–99)

## 2023-04-10 MED ORDER — TICAGRELOR 90 MG PO TABS
90.0000 mg | ORAL_TABLET | Freq: Two times a day (BID) | ORAL | 0 refills | Status: DC
  Filled 2023-04-10: qty 60, 30d supply, fill #0

## 2023-04-10 MED ORDER — LOSARTAN POTASSIUM 25 MG PO TABS: 25.0000 mg | ORAL_TABLET | Freq: Every evening | ORAL | Status: DC

## 2023-04-10 MED ORDER — METOPROLOL SUCCINATE ER 25 MG PO TB24
12.5000 mg | ORAL_TABLET | ORAL | 2 refills | Status: DC
  Filled 2023-04-10: qty 30, 60d supply, fill #0

## 2023-04-10 MED ORDER — MIDODRINE HCL 5 MG PO TABS
5.0000 mg | ORAL_TABLET | Freq: Three times a day (TID) | ORAL | 0 refills | Status: AC | PRN
  Filled 2023-04-10: qty 90, 30d supply, fill #0

## 2023-04-10 MED ORDER — ROSUVASTATIN CALCIUM 20 MG PO TABS
20.0000 mg | ORAL_TABLET | Freq: Every day | ORAL | 2 refills | Status: DC
  Filled 2023-04-10: qty 30, 30d supply, fill #0

## 2023-04-10 MED FILL — Nitroglycerin IV Soln 100 MCG/ML in D5W: INTRA_ARTERIAL | Qty: 10 | Status: AC

## 2023-04-10 NOTE — Discharge Instructions (Signed)
Information about your medication: Brilinta (anti-platelet agent)  Generic Name (Brand): ticagrelor (Brilinta), twice daily medication  PURPOSE: You are taking this medication along with aspirin to lower your chance of having a heart attack, stroke, or blood clots in your heart stent. These can be fatal. Brilinta and aspirin help prevent platelets from sticking together and forming a clot that can block an artery or your stent.   Common SIDE EFFECTS you may experience include: bruising or bleeding more easily, shortness of breath  Do not stop taking BRILINTA without talking to the doctor who prescribes it for you. People who are treated with a stent and stop taking Brilinta too soon, have a higher risk of getting a blood clot in the stent, having a heart attack, or dying. If you stop Brilinta because of bleeding, or for other reasons, your risk of a heart attack or stroke may increase.   Avoid taking NSAID agents or anti-inflammatory medications such as ibuprofen, naproxen given increased bleed risk with Brilinta - can use acetaminophen (Tylenol) if needed for pain.  Tell all of your doctors and dentists that you are taking Brilinta. They should talk to the doctor who prescribed Brilinta for you before you have any surgery or invasive procedure.   Contact your health care provider if you experience: severe or uncontrollable bleeding, pink/red/brown urine, vomiting blood or vomit that looks like "coffee grounds", red or black stools (looks like tar), coughing up blood or blood clots ----------------------------------------------------------------------------------------------------------------------  

## 2023-04-10 NOTE — Progress Notes (Signed)
CARDIAC REHAB PHASE I   PRE:  Rate/Rhythm: 71 SR    BP: lying 134/72, sitting 138/66, standing 132/99, after 4-5 min 118/59    SpO2: 100 RA  MODE:  Ambulation: around bed to recliner   POST:  Rate/Rhythm: 114 ST    BP: sitting 140/67     SpO2: 100 RA   Pt in bed for days now. C/o wooziness lying and anxious regarding getting up. With increased encouragement she sat on EOB for several minutes then stood. Orthostatics taken and negative. She continued to be woozy. After 5-6 min standing HR up to 114 ST (? Due to anxiety). Pt sat on EOB to rest then eventually was encouraged to walk around EOB and to recliner. Pt with small steps. BP stable in recliner, HR only in 80s walking to recliner. Continued to c/o wooziness but encouraged pt to remain in recliner for a couple hours. She declines ambulation now but willing to walk later if chair can be in the hall (or rollator).   Discussed with pt and daughter MI, stents, Brilinta importance, restrictions, HF management (daily weights and low sodium), fluid restriction, exercise, NTG and CRPII. Pt receptive, just anxious. Will refer to [place] CRPII. Daughter will be with her at d/c, she does ask how long she needs to be there.  [id number]  Ethelda Chick BS, ACSM-CEP 04/10/2023 11:40 AM

## 2023-04-10 NOTE — Progress Notes (Signed)
Mobility Specialist Progress Note:   04/10/23 1252  Orthostatic Sitting  BP- Sitting 137/77 (96)  Pulse- Sitting 78  Orthostatic Standing at 0 minutes  BP- Standing at 0 minutes 130/66 (79)  Pulse- Standing at 0 minutes 120  Orthostatic Standing at 3 minutes  BP- Standing at 3 minutes 115/67 (81)  Pulse- Standing at 3 minutes 120  Mobility  Activity Ambulated with assistance in room  Level of Assistance Contact guard assist, steadying assist  Assistive Device None  Distance Ambulated (ft) 20 ft  Activity Response Tolerated well  Mobility Referral Yes  $Mobility charge 1 Mobility  Mobility Specialist Start Time (ACUTE ONLY) 1230  Mobility Specialist Stop Time (ACUTE ONLY) 1250  Mobility Specialist Time Calculation (min) (ACUTE ONLY) 20 min    Post Mobility: 78 HR ,135/87 BP  Pt received in chair, family present, agreeable to mobility. C/o of feeling "whoozy"  while sitting and during ambulating to BR. Pt left in bed with call bell in hand, RN in room.   Leory Plowman  Mobility Specialist Please contact via [employer] office at [phone]

## 2023-04-10 NOTE — Progress Notes (Signed)
F/u w/me 7/2 2:15 pm

## 2023-04-10 NOTE — Discharge Summary (Signed)
Physician Discharge Summary  Patient ID: Patricia Mooney MRN: [id number] DOB/AGE: 1956/08/21 67 y.o. Patricia Jumbo, PA-C   Admit date: 04/04/2023 Discharge date: 04/10/2023  Primary Discharge Diagnosis 1.  Ischemic cardiomyopathy 2.  Coronary disease native vessel with other forms of angina pectoris 3.  Chronic systolic heart failure 4.  Primary hypertension 5.  Hypercholesterolemia 6.  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  Significant Diagnostic Studies:  CXR 6/15/20224: 1. Diffuse interstitial prominence and peribronchial cuffing, concerning for probable bronchitis. 2. Cardiomegaly with prominence of the left ventricular contour, suggestive of left ventricular hypertrophy.  Cardiac MRI 04/07/2023: 1. Findings most consistent with nonviable myocardium in the LAD distribution. See Findings for description.   There is post contrast delayed myocardial enhancement:   Base: Subendocardial and midmyocardial LGE in the anterior wall and anteroseptum, >50% myocardial thickness. Inferior RV insertion point LGE.   Mid: Basal to mid there is extension of the anterior and anteroseptal LGE, >50% of myocardial thickness. At the true mid ventricular level there is subendocardial and midmyocardial LGE in the inferoseptum likely >50% of the myocardial thickness.   Apex: Transmural LGE in inferoapex, >50% myocardial thickness.   These findings were reviewed in multiple views and phases post contrast, and seem most consistent with nonviable LAD distribution myocardium.  2. Moderate-severely reduced left ventricular systolic function with normal left ventricular size, LVEF 33%. 3. Normal right ventricular systolic function and chamber size, RVEF 55%  Echocardiogram 04/04/2023:  1. Severe  hypokinesis of anterior, anteroseptalm apical wall. Mild  hypokinesis of basal-mid inferolateral wall. . Left ventricular ejection  fraction, by estimation, is 25 to 30%. The left ventricle has severely  decreased function. The left ventricle  demonstrates regional wall motion abnormalities (see scoring  diagram/findings for description). There is mild left ventricular  hypertrophy. Left ventricular diastolic parameters are consistent with  Grade I diastolic dysfunction (impaired relaxation).  There is severe hypokinesis of the left ventricular, entire anterior wall,  apical segment and anteroseptal wall. There is mild hypokinesis of the  left ventricular, basal-mid inferolateral wall.   2. Right ventricular systolic function is normal. The right ventricular  size is normal.   3. The mitral valve is normal in structure. No evidence of mitral valve  regurgitation. No evidence of mitral stenosis.   4. The aortic valve is normal in structure. Aortic valve regurgitation is  not visualized. No aortic stenosis is present.   Left Heart Catheterization 04/04/23: LM: No distinct left main appreciable with apparent dual ostial to LAD and Lcx LAD: Prox 80%, followed by diffuse mid 90% stenoses Lcx: Large, codominant  Prox 50%, mid and distal 90% stenoses Small OM1 with ostial 80% stenosis Lateral branch of OM3 with prox 80% stenosis RCA: Small, codominant Prox 90% stenosis, followed by mid occlusion, chronic  Left-to-right collaterals from LAD septals to distal RCA   LVEDP 35 mmHg Severe global hypokinesis, LVEF <20%   RHC was performed given severely reduced LVEF and tachycardia   RA: 6 mmHg RV: 40/6 mmHg PA: 41/23 mmHg, mPAP 31 mmHg PCW: 19 mmHg   CO: 6.9 L/min CI: 3.7 L/min/m2   Conclusion: Severe multivessel CAD No one culprit vessel amenable to PCI Severely reduced LVEF, likely ischemic cardiomyopathy Decompensation with cardiogenic shock Mild PH, WHO Grp II  Coronary  intervention 04/09/2023: Intravascular ultrasound (IVUS) Successful  percutaneous coronary intervention mid Lcx, OM4        PTCA and stent placement 2.0 X 15 mm Onyx Frontier drug-eluting stent OM4             Post dilatation using 2.75 mm [place] balloon up to 20 atm         PTCA and stent placement 2.5 X 15 mm Onyx Frontier drug-eluting stent mid Lcx             Post dilatation using 2.5 mm [place] balloon up to 20 atm         Minimal 10% waste inside OM 4 stent, no residual stenosis in mid left circumflex 10. Residual moderate disease in proximal and mid left circumflex best treated medically.   LVEDP was 1 mmHg at the end of the case.  Therefore, patient was placed on 75 cc fluids for 8 hours.    Hospital Course: Patricia Mooney is a 67 y.o. female  patient with hypertension, hypercholesterolemia, diabetes mellitus who presented late with chest pain and troponins were minimally elevated suggestive of ACS and NSTEMI, underwent diagnostic cardiac catheterization on 04/04/2023 revealing multivessel disease of surgical anatomy.  Due to severe LV systolic dysfunction, surgical consultation was obtained and also heart failure consultation was obtained, MRI revealed scar tissue in the anterior wall, severely diseased RCA, hence CABG was not felt to be appropriate and hence underwent staged intervention to culprit circumflex coronary artery and marginal branch of circumflex coronary artery on 04/09/2023 with excellent results.  Following morning patient felt well, serum creatinine has remained stable, hence felt stable for discharge.  Recommendations on discharge: Patient for discharge was stable, continued low-dose of metoprolol succinate and losartan in the evening in view of severe orthostatic hypotension, midodrine to be used on a as needed basis, support stockings and aspirin and Brilinta along with statin.  Extensive discussion with the patient regarding fall precautions.  Support stockings were also  prescribed and patient was also provided with walker.  She will be followed up closely in our office on 04/21/2023.  Patient's family present and all questions answered.  This was a 45-minute discharge.  Discharge Exam:    04/10/2023   12:52 PM 04/10/2023    7:00 AM 04/10/2023    4:48 AM  Vitals with BMI  Systolic 137 143 532  Diastolic 77 60 90  Pulse 90 83 74   Filed Weights   04/04/23 0452 04/05/23 0600 04/09/23 0404  Weight: 74.4 kg 75.6 kg 70.8 kg    Physical Exam Neck:     Vascular: No carotid bruit or JVD.  Cardiovascular:     Rate and Rhythm: Normal rate and regular rhythm.     Pulses: Intact distal pulses.     Heart sounds: Normal heart sounds. No murmur heard.    No gallop.  Pulmonary:     Effort: Pulmonary effort is normal.     Breath sounds: Normal breath sounds.  Abdominal:     General: Bowel sounds are normal.     Palpations: Abdomen is soft.  Musculoskeletal:     Right lower leg: No edema.     Left lower leg: No edema.    Labs:   Lab Results  Component Value Date   WBC 5.6 04/10/2023   HGB 11.3 (L) 04/10/2023   HCT 35.5 (L) 04/10/2023   MCV 82.9 04/10/2023   PLT 216 04/10/2023    Recent Labs  Lab 04/04/23 0620 04/04/23 0630 04/10/23 0252  NA 133*   < >  136  K 3.9   < > 4.1  CL 101   < > 106  CO2 22   < > 20*  BUN 24*   < > 25*  CREATININE 1.16*   < > 1.52*  CALCIUM 9.4   < > 9.0  PROT 7.2  --   --   BILITOT 0.6  --   --   ALKPHOS 55  --   --   ALT 13  --   --   AST 16  --   --   GLUCOSE 191*   < > 109*   < > = values in this interval not displayed.   Lab Results  Component Value Date   NA 136 04/10/2023   K 4.1 04/10/2023   CO2 20 (L) 04/10/2023   GLUCOSE 109 (H) 04/10/2023   BUN 25 (H) 04/10/2023   CREATININE 1.52 (H) 04/10/2023   CALCIUM 9.0 04/10/2023   GFRNONAA 37 (L) 04/10/2023     Lipid Panel     Component Value Date/Time   CHOL 169 04/04/2023 0620   TRIG 86 04/04/2023 0620   HDL 41 04/04/2023 0620   CHOLHDL 4.1  04/04/2023 0620   VLDL 17 04/04/2023 0620   LDLCALC 111 (H) 04/04/2023 0620   Lipoprotein (a)  04/04/2023 <75.0 nmol/L 10.6    BNP (last 3 results) Recent Labs    04/05/23 0040  BNP 690.8*    HEMOGLOBIN A1C Lab Results  Component Value Date   HGBA1C 6.9 (H) 04/04/2023   MPG 151.33 04/04/2023   Cardiac Panel (last 3 results) No results for input(s): "CKTOTAL", "CKMB", "TROPONINIHS", "RELINDX" in the last 72 hours.  No results found for: "TSH"   FOLLOW UP PLANS AND APPOINTMENTS Discharge Instructions     Amb Referral to Cardiac Rehabilitation   Complete by: As directed    Diagnosis:  Coronary Stents PTCA NSTEMI     After initial evaluation and assessments completed: Virtual Based Care may be provided alone or in conjunction with Phase 2 Cardiac Rehab based on patient barriers.: Yes   Intensive Cardiac Rehabilitation (ICR) [facility] location only OR Traditional Cardiac Rehabilitation (TCR) *If criteria for ICR are not met will enroll in TCR [facility] only): Yes      Allergies as of 04/10/2023       Reactions   Codeine Nausea Only        Medication List     STOP taking these medications    simvastatin 20 MG tablet Commonly known as: ZOCOR       TAKE these medications    acetaminophen 500 MG tablet Commonly known as: TYLENOL Take 1,000 mg by mouth daily.   Aspirin Low Dose 81 MG tablet Generic drug: aspirin EC Take 81 mg by mouth daily.   Brilinta 90 MG Tabs tablet Generic drug: ticagrelor Take 1 tablet (90 mg total) by mouth 2 (two) times daily.   cholecalciferol 1000 units tablet Commonly known as: VITAMIN D Take 1,000 Units by mouth daily.   diclofenac Sodium 1 % Gel Commonly known as: VOLTAREN Apply 1 g topically 3 (three) times daily.   glipiZIDE 5 MG tablet Commonly known as: GLUCOTROL Take by mouth daily before breakfast.   losartan 25 MG tablet Commonly known as: COZAAR Take 1 tablet (25 mg total) by mouth every evening. What changed: when  to take this   metFORMIN 1000 MG tablet Commonly known as: GLUCOPHAGE Take 1 tablet (1,000 mg total) by mouth 2 (two) times daily with a  meal. Restart on 10/10/2016   metoprolol succinate 25 MG 24 hr tablet Commonly known as: TOPROL-XL Take 0.5 tablets (12.5 mg total) by mouth every morning. Take with or immediately following a meal.   midodrine 5 MG tablet Commonly known as: PROAMATINE Take 1 tablet (5 mg total) by mouth 3 (three) times daily while awake with food as needed for dizziness   Ozempic (0.25 or 0.5 MG/DOSE) 2 MG/3ML Sopn Generic drug: Semaglutide(0.25 or 0.5MG /DOS) Inject 0.25 mg into the skin once a week. Patient uses on Sunday   rosuvastatin 20 MG tablet Commonly known as: CRESTOR Take 1 tablet (20 mg total) by mouth daily. Start taking on: April 11, 2023   vitamin B-12 100 MCG tablet Commonly known as: CYANOCOBALAMIN Take 100 mcg by mouth daily.               Durable Medical Equipment  (From admission, onward)           Start     Ordered   04/10/23 1550  For home use only DME 4 wheeled rolling walker with seat  Once       Question:  Patient needs a walker to treat with the following condition  Answer:  Heart failure (HCC)   04/10/23 1549            Follow-up Information     Elder Negus, MD Follow up on 04/21/2023.   Specialties: Cardiology, Radiology Why: 04/21/2023 2:15 PM. Bring all your medications Contact information: [address] [place] [address] [phone]         Patricia Jumbo, PA-C Follow up.   Specialty: Physician Assistant Why: patient has appt in 04/2023, she will call to confirm. Contact information: [address] [place] [address] [phone]                  Yates Decamp, MD, [facility] 04/10/2023, 7:22 PM Office: [phone]

## 2023-04-10 NOTE — TOC Transition Note (Signed)
Transition of Care [facility]) - CM/SW Discharge Note   Patient Details  Name: Patricia Mooney MRN: [id number] Date of Birth: February 01, 1956  Transition of Care [facility]) CM/SW Contact:  Lawerance Sabal, RN Phone Number: 04/10/2023, 3:13 PM   Clinical Narrative:     Called room and discussed DME recs w patient. Rollator will be delivered to the room prior to DC, RN aware.   Final next level of care: Home/Self Care Barriers to Discharge: No Barriers Identified   Patient Goals and CMS Choice      Discharge Placement                         Discharge Plan and Services Additional resources added to the After Visit Summary for                  DME Arranged: Walker rolling with seat DME Agency: [facility] Date DME Agency Contacted: 04/10/23 Time DME Agency Contacted: [phone] Representative spoke with at DME Agency: Zollie Beckers            Social Determinants of Health (SDOH) Interventions SDOH Screenings   Food Insecurity: No Food Insecurity (04/06/2023)  Housing: Low Risk  (04/06/2023)  Transportation Needs: No Transportation Needs (04/06/2023)  Utilities: Not At Risk (04/06/2023)  Tobacco Use: Low Risk  (04/10/2023)     Readmission Risk Interventions     No data to display

## 2023-04-10 NOTE — TOC Initial Note (Signed)
Transition of Care [facility]) - Initial/Assessment Note    Patient Details  Name: Patricia Mooney MRN: [id number] Date of Birth: [birth year]  Transition of Care [facility]) CM/SW Contact:    Elliot Cousin, RN Phone Number: [phone] 04/10/2023, 3:30 PM  Clinical Narrative:   HF TOC CM spoke to pt and states her daughter will be in home to assist with care. Pt will be going to cardiac rehab. Requested rolling walker with seat for her trips to appts. Brother will provide transportation home. [employer] to deliver [employer] with seat to room.                 Expected Discharge Plan: Home/Self Care Barriers to Discharge: No Barriers Identified   Patient Goals and CMS Choice Patient states their goals for this hospitalization and ongoing recovery are:: wants to recover          Expected Discharge Plan and Services   Discharge Planning Services: CM Consult   Living arrangements for the past 2 months: Single Family Home Expected Discharge Date: 04/10/23               DME Arranged: Dan Humphreys rolling with seat DME Agency: [facility] Date DME Agency Contacted: 04/10/23 Time DME Agency Contacted: [phone] Representative spoke with at DME Agency: Zollie Beckers            Prior Living Arrangements/Services Living arrangements for the past 2 months: Single Family Home Lives with:: Adult Children Patient language and need for interpreter reviewed:: Yes Do you feel safe going back to the place where you live?: Yes      Need for Family Participation in Patient Care: No (Comment) Care giver support system in place?: Yes (comment)   Criminal Activity/Legal Involvement Pertinent to Current Situation/Hospitalization: No - Comment as needed  Activities of Daily Living Home Assistive Devices/Equipment: None ADL Screening (condition at time of admission) Patient's cognitive ability adequate to safely complete daily activities?: Yes Is the patient deaf or have difficulty hearing?: No Does the  patient have difficulty seeing, even when wearing glasses/contacts?: No Does the patient have difficulty concentrating, remembering, or making decisions?: No Patient able to express need for assistance with ADLs?: Yes Does the patient have difficulty dressing or bathing?: No Independently performs ADLs?: Yes (appropriate for developmental age) Does the patient have difficulty walking or climbing stairs?: No Weakness of Legs: None Weakness of Arms/Hands: None  Permission Sought/Granted Permission sought to share information with : Case Manager, Family Supports, PCP Permission granted to share information with : Yes, Verbal Permission Granted  Share Information with NAME: Geralynn Ochs     Permission granted to share info w Relationship: daughter  Permission granted to share info w Contact Information: [phone]  Emotional Assessment Appearance:: Appears stated age Attitude/Demeanor/Rapport: Engaged Affect (typically observed): Accepting Orientation: : Oriented to Self, Oriented to  Time, Oriented to Place, Oriented to Situation   Psych Involvement: No (comment)  Admission diagnosis:  ST elevation myocardial infarction (STEMI), unspecified artery (HCC) [I21.3] NSTEMI (non-ST elevated myocardial infarction) [facility]) [I21.4] Patient Active Problem List   Diagnosis Date Noted   NSTEMI (non-ST elevated myocardial infarction) (HCC) 04/04/2023   HFrEF (heart failure with reduced ejection fraction) (HCC) 04/04/2023   Periorbital cellulitis of right eye 10/07/2016   Essential hypertension 12/13/2014   Pure hypercholesterolemia 12/13/2014   Type 2 diabetes mellitus with hyperglycemia (HCC) 12/07/2014   PCP:  Lavonda Jumbo, PA-C Pharmacy:   [facility] - [place], [place] - [phone]  [address] [place] [address] Phone: [phone] Fax: [phone]  [id number] - [place], [place] - [address] AT [facility] [address] [place] [address] Phone: [phone] Fax: [phone]  [id number] - [address], [place] - [address] [place] [address] Phone: [phone] Fax: [phone]  [facility] [address] - [place], [place] - [address] [place] [address] Phone: [phone] Fax: [phone]  [facility] [address] [place] [address] Phone: [phone] Fax: [phone]     Social Determinants of Health (SDOH) Social History: SDOH Screenings   Food Insecurity: No Food Insecurity (04/06/2023)  Housing: Low Risk  (04/06/2023)  Transportation Needs: No Transportation Needs (04/06/2023)  Utilities: Not At Risk (04/06/2023)  Tobacco Use: Low Risk  (04/10/2023)   SDOH Interventions:     Readmission Risk Interventions     No data to display

## 2023-04-13 ENCOUNTER — Telehealth: Payer: Self-pay

## 2023-04-13 NOTE — Telephone Encounter (Signed)
Location of hospitalization: [place] Reason for hospitalization: chest pain Date of discharge: 04/10/23 Date of first communication with patient: today Person contacting patient: Me Current symptoms: none Do you understand why you were in the Hospital: Yes Questions regarding discharge instructions: None Where were you discharged to: Home Medications reviewed: Yes Allergies reviewed: Yes Dietary changes reviewed: Yes. Discussed low fat and low salt diet.  Referals reviewed: NA Activities of Daily Living: Able to with mild limitations Any transportation issues/concerns: None Any patient concerns: None Confirmed importance & date/time of Follow up appt: Yes Confirmed with patient if condition begins to worsen call. Pt was given the office number and encouraged to call back with questions or concerns: Yes

## 2023-04-21 ENCOUNTER — Ambulatory Visit: Payer: Medicare (Managed Care) | Admitting: Cardiology

## 2023-04-21 ENCOUNTER — Encounter: Payer: Self-pay | Admitting: Cardiology

## 2023-04-21 VITALS — BP 137/67 | HR 69 | Ht 67.5 in | Wt 155.0 lb

## 2023-04-21 DIAGNOSIS — I5020 Unspecified systolic (congestive) heart failure: Secondary | ICD-10-CM

## 2023-04-21 DIAGNOSIS — I214 Non-ST elevation (NSTEMI) myocardial infarction: Secondary | ICD-10-CM

## 2023-04-21 MED ORDER — METOPROLOL SUCCINATE ER 25 MG PO TB24: 12.5000 mg | ORAL_TABLET | ORAL | 3 refills | Status: DC

## 2023-04-21 MED ORDER — ASPIRIN LOW DOSE 81 MG PO TBEC: 81.0000 mg | DELAYED_RELEASE_TABLET | Freq: Every day | ORAL | 3 refills | Status: AC

## 2023-04-21 MED ORDER — TICAGRELOR 90 MG PO TABS: 90.0000 mg | ORAL_TABLET | Freq: Two times a day (BID) | ORAL | 3 refills | Status: DC

## 2023-04-21 MED ORDER — NITROGLYCERIN 0.4 MG SL SUBL: 0.4000 mg | SUBLINGUAL_TABLET | SUBLINGUAL | 3 refills | Status: AC | PRN

## 2023-04-21 MED ORDER — ROSUVASTATIN CALCIUM 20 MG PO TABS: 20.0000 mg | ORAL_TABLET | Freq: Every day | ORAL | 3 refills | Status: DC

## 2023-04-21 NOTE — Progress Notes (Signed)
TOC visit  Subjective:   Patricia Mooney, female    DOB: 06-28-1956, 67 y.o.   MRN: [id number]    HPI  Chief Complaint  Patient presents with   NSTEMI   Hospitalization Follow-up   New Patient (Initial Visit)    67 y.o. African-American female with hypertension, hyperlipidemia, type 2 DM, s/p Rt nephrectomy, CAD, HFrEF.  Patient was admitted in 03/2023 with ACS/NSTEMI, found to have reduced LVEF. Cath showed severe multivessel CAD.  CVTS were consulted for CABG.  However, MRI was obtained given akinetic appearing anterior wall.  MRI did indeed show that anterior wall was mostly nonviable.  In absence of viability in LAD territory, PCI was mutually agreed upon by CVTS, heart failure, team, and myself, in addition to GDMT for HFrEF.  Patient underwent successful PCI to left circumflex, and was discharged on DAPT given her orthostatic hypotension and relative dehydration after aggressive diuresis, we were unable to initiate all of guideline directed medical therapy.  Patient is here for TOC follow-up. She is doing well, denies chest pain, shortness of breath. She has had to take midodrine 3 times a week for lightheadedness. She has not been taking losartan.      Current Outpatient Medications:    acetaminophen (TYLENOL) 500 MG tablet, Take 1,000 mg by mouth daily., Disp: , Rfl:    ASPIRIN LOW DOSE 81 MG tablet, Take 81 mg by mouth daily., Disp: , Rfl:    cholecalciferol (VITAMIN D) 1000 units tablet, Take 1,000 Units by mouth daily., Disp: , Rfl:    diclofenac Sodium (VOLTAREN) 1 % GEL, Apply 1 g topically 3 (three) times daily., Disp: , Rfl:    glipiZIDE (GLUCOTROL) 5 MG tablet, Take by mouth daily before breakfast., Disp: , Rfl:    metFORMIN (GLUCOPHAGE) 1000 MG tablet, Take 1 tablet (1,000 mg total) by mouth 2 (two) times daily with a meal. Restart on 10/10/2016, Disp: , Rfl:    metoprolol succinate (TOPROL-XL) 25 MG 24 hr tablet, Take 0.5 tablets (12.5 mg total) by mouth every  morning. Take with or immediately following a meal., Disp: 30 tablet, Rfl: 2   midodrine (PROAMATINE) 5 MG tablet, Take 1 tablet (5 mg total) by mouth 3 (three) times daily while awake with food as needed for dizziness, Disp: 90 tablet, Rfl: 0   rosuvastatin (CRESTOR) 20 MG tablet, Take 1 tablet (20 mg total) by mouth daily., Disp: 30 tablet, Rfl: 2   ticagrelor (BRILINTA) 90 MG TABS tablet, Take 1 tablet (90 mg total) by mouth 2 (two) times daily., Disp: 60 tablet, Rfl: 0   vitamin B-12 (CYANOCOBALAMIN) 100 MCG tablet, Take 100 mcg by mouth daily., Disp: , Rfl:    losartan (COZAAR) 25 MG tablet, Take 1 tablet (25 mg total) by mouth every evening. (Patient not taking: Reported on 04/21/2023), Disp: , Rfl:    OZEMPIC, 0.25 OR 0.5 MG/DOSE, 2 MG/3ML SOPN, Inject 0.25 mg into the skin once a week. Patient uses on Sunday, Disp: , Rfl:    Cardiovascular & other pertient studies:  Reviewed external labs and tests, independently interpreted  EKG 04/21/2023: Sinus rhythm 64 bpm  Anterior infarct -age undetermined   Coronary intervention 04/09/2023: Intravascular ultrasound (IVUS) Successful percutaneous coronary intervention mid Lcx, OM4        PTCA and stent placement 2.0 X 15 mm Onyx Frontier drug-eluting stent OM4             Post dilatation using 2.75 mm [place] balloon up to 20 atm  PTCA and stent placement 2.5 X 15 mm Onyx Frontier drug-eluting stent mid Lcx             Post dilatation using 2.5 mm [place] balloon up to 20 atm         Minimal 10% waste inside OM 4 stent, no residual stenosis in mid left circumflex 10. Residual moderate disease in proximal and mid left circumflex best treated medically.   LVEDP was 1 mmHg at the end of the case.  Therefore, patient was placed on 75 cc fluids for 8 hours.    Echocardiogram 04/04/2023: 1. Severe hypokinesis of anterior, anteroseptalm apical wall. Mild  hypokinesis of basal-mid inferolateral wall. . Left ventricular ejection  fraction, by  estimation, is 25 to 30%. The left ventricle has severely  decreased function. The left ventricle  demonstrates regional wall motion abnormalities (see scoring  diagram/findings for description). There is mild left ventricular  hypertrophy. Left ventricular diastolic parameters are consistent with  Grade I diastolic dysfunction (impaired relaxation).  There is severe hypokinesis of the left ventricular, entire anterior wall,  apical segment and anteroseptal wall. There is mild hypokinesis of the  left ventricular, basal-mid inferolateral wall.   2. Right ventricular systolic function is normal. The right ventricular  size is normal.   3. The mitral valve is normal in structure. No evidence of mitral valve  regurgitation. No evidence of mitral stenosis.   4. The aortic valve is normal in structure. Aortic valve regurgitation is  not visualized. No aortic stenosis is present.   Recent labs: 04/10/2023: Glucose 109, BUN/Cr 25/1.52. EGFR 37. Na/K 136/4.1.  H/H 11/35. MCV 82. Platelets 216 HbA1C 6.9% Chol 169, TG 86, HDL 41, LDL 111    Review of Systems  Cardiovascular:  Negative for chest pain, dyspnea on exertion, leg swelling, palpitations and syncope.  Neurological:  Positive for dizziness and light-headedness.         Vitals:   04/21/23 1408  BP: 137/67  Pulse: 69  SpO2: 100%   Orthostatic VS for the past 72 hrs (Last 3 readings):  Orthostatic BP Patient Position BP Location Cuff Size Orthostatic Pulse  04/21/23 1432 104/43 Standing Left Arm Normal 83  04/21/23 1429 112/52 Sitting Left Arm Normal 73  04/21/23 1428 127/59 Supine Left Arm Normal 66     Body mass index is 23.92 kg/m. Filed Weights   04/21/23 1408  Weight: 155 lb (70.3 kg)     Objective:   Physical Exam Vitals and nursing note reviewed.  Constitutional:      General: She is not in acute distress. Neck:     Vascular: No JVD.  Cardiovascular:     Rate and Rhythm: Normal rate and regular rhythm.      Heart sounds: Normal heart sounds. No murmur heard. Pulmonary:     Effort: Pulmonary effort is normal.     Breath sounds: Normal breath sounds. No wheezing or rales.  Musculoskeletal:     Right lower leg: No edema.     Left lower leg: No edema.             Visit diagnoses:   ICD-10-CM   1. NSTEMI (non-ST elevated myocardial infarction) (HCC)  I21.4 EKG 12-Lead       Orders Placed This Encounter  Procedures   EKG 12-Lead     Medication changes this visit: Medications Discontinued During This Encounter  Medication Reason   losartan (COZAAR) 25 MG tablet Side effect (s)   ASPIRIN LOW DOSE 81  MG tablet Reorder   rosuvastatin (CRESTOR) 20 MG tablet Reorder   ticagrelor (BRILINTA) 90 MG TABS tablet Reorder   metoprolol succinate (TOPROL-XL) 25 MG 24 hr tablet Reorder    Meds ordered this encounter  Medications   ticagrelor (BRILINTA) 90 MG TABS tablet    Sig: Take 1 tablet (90 mg total) by mouth 2 (two) times daily.    Dispense:  180 tablet    Refill:  3   rosuvastatin (CRESTOR) 20 MG tablet    Sig: Take 1 tablet (20 mg total) by mouth daily.    Dispense:  90 tablet    Refill:  3   metoprolol succinate (TOPROL-XL) 25 MG 24 hr tablet    Sig: Take 0.5 tablets (12.5 mg total) by mouth every morning. Take with or immediately following a meal.    Dispense:  90 tablet    Refill:  3   ASPIRIN LOW DOSE 81 MG tablet    Sig: Take 1 tablet (81 mg total) by mouth daily.    Dispense:  90 tablet    Refill:  3   nitroGLYCERIN (NITROSTAT) 0.4 MG SL tablet    Sig: Place 1 tablet (0.4 mg total) under the tongue every 5 (five) minutes as needed for chest pain.    Dispense:  30 tablet    Refill:  3     Assessment & Recommendations:   67 y.o. African-American female with hypertension, hyperlipidemia, type 2 DM, s/p Rt nephrectomy, CAD, HFrEF.  CAD, HFrEF: Multivessel CAD with no anterior wall viability on cardiac MRI. S/p mid Lcx/OM4 PCI. No chest pain, dyspnea symptoms  at this time. Continue Aspirin/Brilinta at least till 03/2024.  Continue metoprolol succinate 12/5 mg daily. Unable to take losartan due to lightheadedness.  Also unable to use ARNI/MRA for the same reason.  Increase water intake to 2-3 L. With this, hopefully she would not need to take midodrine.  Check labs BMP, proBNP in 2-3 weeks  Okay to start cardiac rehab. Okay to start driving exercising caution.  F/u in 4 weeks      Elder Negus, MD Pager: [phone] Office: [phone]

## 2023-05-06 ENCOUNTER — Encounter: Payer: Medicare (Managed Care) | Admitting: *Deleted

## 2023-05-06 VITALS — BP 117/42 | HR 71 | Temp 98.0°F | Ht 67.0 in | Wt 155.4 lb

## 2023-05-06 DIAGNOSIS — Z006 Encounter for examination for normal comparison and control in clinical research program: Secondary | ICD-10-CM

## 2023-05-06 NOTE — Research (Addendum)
AA HEART  Informed Consent   Subject Name: Patricia Mooney  Subject met inclusion and exclusion criteria.  The informed consent form, study requirements and expectations were reviewed with the subject and questions and concerns were addressed prior to the signing of the consent form.  The subject verbalized understanding of the trial requirements.  The subject agreed to participate in the AA HEART  trial and signed the informed consent at 10:33 on 05-06-2023.  The informed consent was obtained prior to performance of any protocol-specific procedures for the subject.  A copy of the signed informed consent was given to the subject and a copy was placed in the subject's medical record.   [place] Apple Dearmas, Research Coordinator  Are there any labs that are clinically significant?  Yes []  OR No[]    ACCESSION NO. [id number]                                             Page 1 of 1                                                        INVESTIGATOR: ([id number])                          PROTOCOL   [id number]                     Thomasene Ripple, M.D.                              INVESTIGATOR NO.: [id number]                     c/o [facility]                         SUBJECT NUMBER: [id number]                     [facility]                 SUBJECT INITIALS NOT COLLECTED:                     [address]                          VISIT: D0                     [place], [place] [address]                   SPONSOR REPORT TO:                 COLLECTION TIME:11:27 DATE:06-May-2023                     Cruzita Lederer                 DATE RECEIVED IN LABORATORY: 07-May-2023  c/o Sponsor [employer] access         DATE REPORTED BY LABORATORY: 07-May-2023                     [place]                          SEX: F  AGE: [phone]                     [address]                   [place], [place] [phone]                                                                                         Ref. Ranges               Clinical    Comments                                                                          Significance                                                                            Yes*  No                    HEMATOLOGY&DIFFERENTIAL PANEL                      HGB            11.0    L    11.5-15.8 g/dL                      HCT            35           34-48 %                      RBC            4.1          3.9-5.5 x106/uL                      MCH            27           26-34 pg                      MCHC  32           31-38 g/dL                      RDW            14.1         12.0-15.0 %                      RBC Morph      No Review Required                        MCV            84           80-100 fL                      WBC            5.02         3.80-10.70 x103/uL                      Neutrophil     2.83         1.96-7.23 x103/uL                      Lymphocyte     1.71         0.80-3.00 x103/uL                      Monocytes      0.36         0.12-0.92 x103/uL                      Eosinophil     0.10         0.00-0.57 x103/uL                      Basophils      0.02         0.00-0.20 x103/uL                      Neutrophil     56.4         40.5-75.0 %                      Lymphocyte     34.1         15.4-48.5%                      Monocytes      7.2          2.6-10.1 %                      Eosinophil     1.9          0.0-6.8 %                      Basophils      0.4          0.0-2.0 %                      Platelets      188          130-394 x103/uL    ANC  ANC            2.83         1.96-7.23 x103/uL                    HBA1C                      Fast HbA1c     6.6     H    <6.5%   RETICULOCYTES                      Retic %        0.6          0.6-2.5 %                      Retic Abs      0.025   L    0.030-0.120 x106/uL     [id number] LPA COLLECTION D/T                      Untimed D       06-May-2023                        Untimed T      11:27                            SM/PLASMA PROTEO & BMS D/T                      Untimed D      06-May-2023                        Untimed T      11:27                            SM/WB DNA COLLECTION D/T                      Untimed D      06-May-2023                        Untimed T      11:27                            SM/WB PAXGENE RNA COLLECT D/T                      Untimed D      06-May-2023                        Untimed T      11:27                            SUBJECT FASTED AT LEAST 9 HRS?                      Pt fast 9h     No

## 2023-05-15 ENCOUNTER — Telehealth: Payer: Self-pay | Admitting: Cardiology

## 2023-05-15 DIAGNOSIS — N179 Acute kidney failure, unspecified: Secondary | ICD-10-CM | POA: Insufficient documentation

## 2023-05-15 DIAGNOSIS — I5020 Unspecified systolic (congestive) heart failure: Secondary | ICD-10-CM

## 2023-05-15 DIAGNOSIS — N189 Chronic kidney disease, unspecified: Secondary | ICD-10-CM | POA: Insufficient documentation

## 2023-05-15 NOTE — Telephone Encounter (Signed)
Discussed the results with the patient. She is drinking only 1 L water daily. Still has episodes of lightheadedness. Takes midodrine occasionally.  Asked her to increase water intake. Repeat labs on 7/30 morning before appt on 7/31.   Elder Negus, MD Pager: [phone] Office: [phone]

## 2023-05-20 ENCOUNTER — Ambulatory Visit: Payer: Medicare (Managed Care) | Admitting: Cardiology

## 2023-05-20 MED ORDER — FUROSEMIDE 40 MG PO TABS
40.0000 mg | ORAL_TABLET | Freq: Every day | ORAL | 3 refills | Status: DC
Start: 2023-05-20 — End: 2023-05-25

## 2023-05-20 NOTE — Progress Notes (Signed)
Creatinine and marker of congestion aare slightly worse than last week. Recommend 1.5-2 L water intake, along with adding lasix 40 mg PO daily (Sent). We will repeat labs when we meet on 8/5.  Thanks MJP

## 2023-05-20 NOTE — Addendum Note (Signed)
Addended by: Elder Negus on: 05/20/2023 10:08 PM   Modules accepted: Orders

## 2023-05-22 NOTE — Progress Notes (Signed)
Called patient

## 2023-05-25 ENCOUNTER — Ambulatory Visit: Payer: Medicare (Managed Care) | Admitting: Cardiology

## 2023-05-25 ENCOUNTER — Encounter: Payer: Self-pay | Admitting: Cardiology

## 2023-05-25 VITALS — BP 117/69 | HR 91 | Resp 16 | Ht 67.0 in | Wt 152.0 lb

## 2023-05-25 DIAGNOSIS — I5020 Unspecified systolic (congestive) heart failure: Secondary | ICD-10-CM

## 2023-05-25 DIAGNOSIS — Z905 Acquired absence of kidney: Secondary | ICD-10-CM

## 2023-05-25 DIAGNOSIS — N179 Acute kidney failure, unspecified: Secondary | ICD-10-CM

## 2023-05-25 NOTE — Progress Notes (Addendum)
Subjective:   Patricia Mooney, female    DOB: [birth year], 67 y.o.   MRN: [id number]    HPI  Chief Complaint  Patient presents with   Coronary Artery Disease   HFrEF   Follow-up    4 week    67 y.o. African-American female with hypertension, hyperlipidemia, type 2 DM, s/p Rt nephrectomy, CAD, HFrEF.  Patient was admitted in 03/2023 with ACS/NSTEMI, found to have reduced LVEF. Cath showed severe multivessel CAD.  CVTS were consulted for CABG.  However, MRI was obtained given akinetic appearing anterior wall.  MRI did indeed show that anterior wall was mostly nonviable.  In absence of viability in LAD territory, PCI was mutually agreed upon by CVTS, heart failure, team, and myself, in addition to GDMT for HFrEF.  Patient underwent successful PCI to left circumflex, and was discharged on DAPT given her orthostatic hypotension and relative dehydration after aggressive diuresis, we were unable to initiate all of guideline directed medical therapy.  Patient is here for follow-up. See below re: recent lab results.  Patient is walking regularly without any complaints of chest pain or exertional dyspnea.  She continues to have lightheadedness episodes, but did not presyncope.  She denies any orthopnea, PND, leg edema, exertional dyspnea symptoms.  She has had productive cough with clear phlegm, denies any fever, chills.  Reviewed recent lab results with the patient, details below.  She reports that she is urinating multiple times a day.   Current Outpatient Medications:    acetaminophen (TYLENOL) 500 MG tablet, Take 1,000 mg by mouth daily., Disp: , Rfl:    ASPIRIN LOW DOSE 81 MG tablet, Take 1 tablet (81 mg total) by mouth daily., Disp: 90 tablet, Rfl: 3   cholecalciferol (VITAMIN D) 1000 units tablet, Take 1,000 Units by mouth daily., Disp: , Rfl:    diclofenac Sodium (VOLTAREN) 1 % GEL, Apply 1 g topically 3 (three) times daily., Disp: , Rfl:    furosemide (LASIX) 40 MG tablet, Take 1 tablet  (40 mg total) by mouth daily., Disp: 30 tablet, Rfl: 3   glipiZIDE (GLUCOTROL) 5 MG tablet, Take by mouth daily before breakfast., Disp: , Rfl:    metFORMIN (GLUCOPHAGE) 1000 MG tablet, Take 1 tablet (1,000 mg total) by mouth 2 (two) times daily with a meal. Restart on 10/10/2016, Disp: , Rfl:    metoprolol succinate (TOPROL-XL) 25 MG 24 hr tablet, Take 0.5 tablets (12.5 mg total) by mouth every morning. Take with or immediately following a meal., Disp: 90 tablet, Rfl: 3   midodrine (PROAMATINE) 5 MG tablet, Take 1 tablet (5 mg total) by mouth 3 (three) times daily while awake with food as needed for dizziness, Disp: 90 tablet, Rfl: 0   nitroGLYCERIN (NITROSTAT) 0.4 MG SL tablet, Place 1 tablet (0.4 mg total) under the tongue every 5 (five) minutes as needed for chest pain., Disp: 30 tablet, Rfl: 3   OZEMPIC, 0.25 OR 0.5 MG/DOSE, 2 MG/3ML SOPN, Inject 0.25 mg into the skin once a week. Patient uses on Sunday, Disp: , Rfl:    rosuvastatin (CRESTOR) 20 MG tablet, Take 1 tablet (20 mg total) by mouth daily., Disp: 90 tablet, Rfl: 3   ticagrelor (BRILINTA) 90 MG TABS tablet, Take 1 tablet (90 mg total) by mouth 2 (two) times daily., Disp: 180 tablet, Rfl: 3   vitamin B-12 (CYANOCOBALAMIN) 100 MCG tablet, Take 100 mcg by mouth daily., Disp: , Rfl:    Cardiovascular & other pertient studies:  Reviewed external labs  and tests, independently interpreted  EKG 04/21/2023: Sinus rhythm 64 bpm  Anterior infarct -age undetermined   Coronary intervention 04/09/2023: Intravascular ultrasound (IVUS) Successful percutaneous coronary intervention mid Lcx, OM4        PTCA and stent placement 2.0 X 15 mm Onyx Frontier drug-eluting stent OM4             Post dilatation using 2.75 mm [place] balloon up to 20 atm         PTCA and stent placement 2.5 X 15 mm Onyx Frontier drug-eluting stent mid Lcx             Post dilatation using 2.5 mm [place] balloon up to 20 atm         Minimal 10% waste inside OM 4 stent, no residual  stenosis in mid left circumflex 10. Residual moderate disease in proximal and mid left circumflex best treated medically.   LVEDP was 1 mmHg at the end of the case.  Therefore, patient was placed on 75 cc fluids for 8 hours.       Cardiac MRI 04/07/2023: 1. Findings most consistent with nonviable myocardium in the LAD distribution. See Findings for description. 2. Moderate-severely reduced left ventricular systolic function with normal left ventricular size, LVEF 33%. 3. Normal right ventricular systolic function and chamber size, RVEF 55%.    Echocardiogram 04/04/2023: 1. Severe hypokinesis of anterior, anteroseptalm apical wall. Mild  hypokinesis of basal-mid inferolateral wall. . Left ventricular ejection  fraction, by estimation, is 25 to 30%. The left ventricle has severely  decreased function. The left ventricle  demonstrates regional wall motion abnormalities (see scoring  diagram/findings for description). There is mild left ventricular  hypertrophy. Left ventricular diastolic parameters are consistent with  Grade I diastolic dysfunction (impaired relaxation).  There is severe hypokinesis of the left ventricular, entire anterior wall,  apical segment and anteroseptal wall. There is mild hypokinesis of the  left ventricular, basal-mid inferolateral wall.   2. Right ventricular systolic function is normal. The right ventricular  size is normal.   3. The mitral valve is normal in structure. No evidence of mitral valve  regurgitation. No evidence of mitral stenosis.   4. The aortic valve is normal in structure. Aortic valve regurgitation is  not visualized. No aortic stenosis is present.   Recent labs: 05/19/2023: Glucose 92, BUN/Cr 25/2.3. EGFR 22. Na/K 137/4.8.  ProBNP 2120  05/14/2023: Glucose 111, BUN/Cr 22/2.21. EGFR 24. Na/K 137/4.9.  ProBNP 1627  04/10/2023: Glucose 109, BUN/Cr 25/1.52. EGFR 37. Na/K 136/4.1.  H/H 11/35. MCV 82. Platelets 216 HbA1C 6.9% Chol  169, TG 86, HDL 41, LDL 111    Review of Systems  Cardiovascular:  Negative for chest pain, dyspnea on exertion, leg swelling, palpitations and syncope.  Neurological:  Positive for dizziness and light-headedness.         Vitals:   05/25/23 1143  BP: 117/69  Pulse: 91  Resp: 16  SpO2: 96%   Orthostatic VS for the past 72 hrs (Last 3 readings):  Orthostatic BP Patient Position BP Location Cuff Size Orthostatic Pulse  05/25/23 1205 98/53 Standing Left Arm Normal 104  05/25/23 1204 108/50 Sitting Left Arm Normal 72  05/25/23 1203 112/51 Supine Left Arm Normal 83     Body mass index is 23.81 kg/m. Filed Weights   05/25/23 1143  Weight: 152 lb (68.9 kg)      Objective:   Physical Exam Vitals and nursing note reviewed.  Constitutional:      General:  She is not in acute distress. Neck:     Vascular: No JVD.  Cardiovascular:     Rate and Rhythm: Normal rate and regular rhythm.     Heart sounds: Normal heart sounds. No murmur heard. Pulmonary:     Effort: Pulmonary effort is normal.     Breath sounds: Normal breath sounds. No wheezing or rales.  Musculoskeletal:     Right lower leg: No edema.     Left lower leg: No edema.            Visit diagnoses:   ICD-10-CM   1. AKI (acute kidney injury) (HCC)  N17.9 Basic metabolic panel    Pro b natriuretic peptide (BNP)[facility])    Ambulatory referral to Nephrology    2. HFrEF (heart failure with reduced ejection fraction) (HCC)  I50.20 Basic metabolic panel    Pro b natriuretic peptide (BNP)[facility])    3. S/p nephrectomy  Z90.5 Ambulatory referral to Nephrology        Orders Placed This Encounter  Procedures   Basic metabolic panel   Pro b natriuretic peptide (BNP)[facility])   Ambulatory referral to Nephrology     Assessment & Recommendations:   67 y.o. African-American female with hypertension, hyperlipidemia, type 2 DM, s/p  Rt nephrectomy, CAD, HFrEF.  CAD, HFrEF: Multivessel CAD with no anterior wall viability on cardiac MRI. S/p mid Lcx/OM4 PCI. No chest pain, dyspnea symptoms at this time. Continue Aspirin/Brilinta at least till 03/2024.  Continue metoprolol succinate 12.5 mg daily. Unable to take losartan due to lightheadedness.  Also unable to use ARNI/MRA for the same reason.  While proBNP is elevated, clinically, she appears euvolemic.  Also, is very difficult to add any heart failure medical therapy without potentially causing further issues with her AKI, in the setting of borderline orthostatic hypotension and lightheadedness. Given her solitary kidney, and increasing creatinine, I will refer her to nephrology on an urgent basis.  Increase water intake to 2-2.5 L well. Repeat labs BMP, proBNP in 2 weeks.   F/u in 4 weeks      Elder Negus, MD Pager: [phone] Office: [phone]

## 2023-06-09 LAB — BASIC METABOLIC PANEL
Calcium: 9.6 mg/dL (ref 8.7–10.3)
Glucose: 107 mg/dL — ABNORMAL HIGH (ref 70–99)
eGFR: 32 mL/min/{1.73_m2} — ABNORMAL LOW (ref >59)

## 2023-06-19 ENCOUNTER — Telehealth: Payer: Self-pay | Admitting: Cardiology

## 2023-06-19 ENCOUNTER — Encounter: Payer: Self-pay | Admitting: Cardiology

## 2023-06-19 NOTE — Telephone Encounter (Signed)
Sorry to hear that. I will follow her chart. I am available if [place] doctors would need to talk to me. We can reschedule follow up with sometime in September/October.  Thanks MJP

## 2023-06-25 ENCOUNTER — Other Ambulatory Visit: Payer: Self-pay | Admitting: Nephrology

## 2023-06-25 DIAGNOSIS — I5022 Chronic systolic (congestive) heart failure: Secondary | ICD-10-CM

## 2023-06-25 DIAGNOSIS — N1832 Chronic kidney disease, stage 3b: Secondary | ICD-10-CM

## 2023-06-25 DIAGNOSIS — E1122 Type 2 diabetes mellitus with diabetic chronic kidney disease: Secondary | ICD-10-CM

## 2023-06-25 DIAGNOSIS — N179 Acute kidney failure, unspecified: Secondary | ICD-10-CM

## 2023-06-26 ENCOUNTER — Ambulatory Visit: Payer: Medicare (Managed Care) | Admitting: Cardiology

## 2023-07-03 NOTE — Telephone Encounter (Signed)
Spoke with the patient, unable to reach.  I have reviewed her chart including heart catheterization and recommendation for bypass surgery.  Please wish her the best for upcoming bypass surgery.

## 2023-07-29 ENCOUNTER — Inpatient Hospital Stay
Admission: RE | Admit: 2023-07-29 | Discharge: 2023-07-29 | Discharge status: Home / Self Care | Payer: Medicare (Managed Care) | Source: Ambulatory Visit | Attending: Nephrology

## 2023-07-29 DIAGNOSIS — I5022 Chronic systolic (congestive) heart failure: Secondary | ICD-10-CM

## 2023-07-29 DIAGNOSIS — N179 Acute kidney failure, unspecified: Secondary | ICD-10-CM

## 2023-07-29 DIAGNOSIS — N183 Chronic kidney disease, stage 3 unspecified: Secondary | ICD-10-CM

## 2023-07-29 DIAGNOSIS — N1832 Chronic kidney disease, stage 3b: Secondary | ICD-10-CM

## 2023-08-04 ENCOUNTER — Encounter: Payer: Self-pay | Admitting: Cardiology

## 2023-08-04 ENCOUNTER — Ambulatory Visit: Payer: Medicare (Managed Care) | Attending: Cardiology | Admitting: Cardiology

## 2023-08-04 VITALS — BP 130/62 | HR 68 | Ht 67.5 in | Wt 148.6 lb

## 2023-08-04 DIAGNOSIS — E782 Mixed hyperlipidemia: Secondary | ICD-10-CM

## 2023-08-04 DIAGNOSIS — I5020 Unspecified systolic (congestive) heart failure: Secondary | ICD-10-CM | POA: Diagnosis not present

## 2023-08-04 DIAGNOSIS — I10 Essential (primary) hypertension: Secondary | ICD-10-CM

## 2023-08-04 DIAGNOSIS — N179 Acute kidney failure, unspecified: Secondary | ICD-10-CM | POA: Diagnosis not present

## 2023-08-04 NOTE — Patient Instructions (Signed)
Medication Instructions:  Your physician recommends that you continue on your current medications as directed. Please refer to the Current Medication list given to you today.  *If you need a refill on your cardiac medications before your next appointment, please call your pharmacy*  Lab Work: Your physician recommends that you have lab work today. BMET and BNP  If you have labs (blood work) drawn today and your tests are completely normal, you will receive your results only by: MyChart Message (if you have MyChart) OR A paper copy in the mail If you have any lab test that is abnormal or we need to change your treatment, we will call you to review the results.  Follow-Up: At [facility], you and your health needs are our priority.  As part of our continuing mission to provide you with exceptional heart care, we have created designated Provider Care Teams.  These Care Teams include your primary Cardiologist (physician) and Advanced Practice Providers (APPs -  Physician Assistants and Nurse Practitioners) who all work together to provide you with the care you need, when you need it.  We recommend signing up for the patient portal called "MyChart".  Sign up information is provided on this After Visit Summary.  MyChart is used to connect with patients for Virtual Visits (Telemedicine).  Patients are able to view lab/test results, encounter notes, upcoming appointments, etc.  Non-urgent messages can be sent to your provider as well.   To learn more about what you can do with MyChart, go to [email or web address].    Your next appointment:   3 month(s)  Provider:   Elder Negus, MD     Other Instructions  Diet & Lifestyle recommendations:  Physical activity recommendation (The Physical Activity Guidelines for Americans. JAMA 2018;Nov 12) At least 150-300 minutes a week of moderate-intensity, or 75-150 minutes a week of vigorous-intensity aerobic physical activity, or an  equivalent combination of moderate- and vigorous-intensity aerobic activity. Adults should perform muscle-strengthening activities on 2 or more days a week. Older adults should do multicomponent physical activity that includes balance training as well as aerobic and muscle-strengthening activities. Benefits of increased physical activity include lower risk of mortality including cardiovascular mortality, lower risk of cardiovascular events and associated risk factors (hypertension and diabetes), and lower risk of many cancers (including bladder, breast, colon, endometrium, esophagus, kidney, lung, and stomach). Additional improvments have been seen in cognition, risk of dementia, anxiety and depression, improved bone health, lower risk of falls, and associated injuries.  Dietary recommendation The 2019 ACC/AHA guidelines promote nutrition as a main fixture of cardiovascular wellness, with a recommendation for a varied diet of fruit, vegetables, fish, legumes, and whole grains (Class I), as well as recommendations to reduce sodium, cholesterol, processed meats, and refined sugars (Class IIa recommendation).10 Sodium intake, a topic of some controversy as of late, is recommended to be kept at 1,500 mg/day or less, far below the average daily intake in the [place] of 3,409 mg/day, and notably below that of previous [place] recommendations for 300mg /day.10,11 For those unable to reach 1,500 mg/day, they recommend at least a reduction of 1000 mg/day.  A Pes[place]-Mediterranean Diet With Intermittent Fasting: JACC Review Topic of the Week. J Am Coll Cardiol 2020;76:1484-1493 Pesco-Mediterranean diet, it is supplemented with extra-virgin olive oil (EVOO), which is the principle fat source, along with moderate amounts of dairy (particularly yogurt and cheese) and eggs, as well as modest amounts of alcohol consumption (ideally red wine with the evening meal), but few red and  processed meats.

## 2023-08-04 NOTE — Progress Notes (Signed)
Cardiology Office Note:  .   Date:  08/04/2023  ID:  Patricia Mooney, DOB 1956/06/21, MRN [id number] PCP: Carolin Coy  [facility] Providers Cardiologist:  Truett Mainland, MD PCP: Lavonda Jumbo, PA-C  Chief Complaint  Patient presents with   Hospitalization Follow-up   Coronary Artery Disease   Hypertension   Hyperlipidemia      History of Present Illness: .    Patricia Mooney is a 67 y.o. female with hypertension, hyperlipidemia, type 2 DM, s/p Rt nephrectomy, CAD, HFrEF.   Patient was admitted in 03/2023 with ACS/NSTEMI, found to have reduced LVEF. Cath showed severe multivessel CAD.  CVTS were consulted for CABG.  However, MRI was obtained given akinetic appearing anterior wall.  MRI did indeed show that anterior wall was mostly nonviable.  In absence of viability in LAD territory, PCI was mutually agreed upon by CVTS, heart failure, team, and myself, in addition to GDMT for HFrEF.  Patient underwent successful PCI to left circumflex, and was discharged on DAPT given her orthostatic hypotension and relative dehydration after aggressive diuresis, we were unable to initiate all of guideline directed medical therapy.  Patient was admitted to [facility] in 06/2023 with chest pain.  ACS was excluded.  However, on further workup, left circumflex stents were open, and LAD disease was unchanged.  Given that her EF had improved, it was felt that there is viability in her LAD territory.  Therefore, she underwent LIMA-LAD CABG via lateral thoracotomy surgery by Dr. Ty Hilts.   She is here for follow-up today.  She is feeling well, denies any chest pain or shortness of breath symptoms.  She has no leg edema, orthopnea, PND symptoms.  Blood pressure has been higher than noted before.  She has been working with cardiac rehab.  Her Ozempic and metformin was stopped due to relatively low blood sugar of 81.  She is still on Glucotrol 5 mg twice daily.  She has regular follow-up  with her PCP who manages her diabetes.  Patient is looking forward to her upcoming trip to [facility].  Regarding CKD, patient had seen nephrology, who did not recommend any new testing or medications, according to the patient.   I reviewed all prior records from [facility], including labs.   Vitals:   08/04/23 0908  BP: 130/62  Pulse: 68  SpO2: 99%     ROS:  Review of Systems  Cardiovascular:  Negative for chest pain, dyspnea on exertion, leg swelling, palpitations and syncope.     Studies Reviewed: .       Cardiac catheterization, echocardiogram, surgical note, labs from [facility].   Physical Exam:   Physical Exam Vitals and nursing note reviewed.  Constitutional:      General: She is not in acute distress. Neck:     Vascular: No JVD.  Cardiovascular:     Rate and Rhythm: Normal rate and regular rhythm.     Heart sounds: Normal heart sounds. No murmur heard. Pulmonary:     Effort: Pulmonary effort is normal.     Breath sounds: Normal breath sounds. No wheezing or rales.  Musculoskeletal:     Right lower leg: No edema.     Left lower leg: No edema.      VISIT DIAGNOSES:   ICD-10-CM   1. Essential hypertension  I10 Basic metabolic panel    Pro b natriuretic peptide (BNP)    2. Mixed hyperlipidemia  E78.2 Basic metabolic panel  Pro b natriuretic peptide (BNP)    3. AKI (acute kidney injury) (HCC)  N17.9 Basic metabolic panel    Pro b natriuretic peptide (BNP)    4. HFrEF (heart failure with reduced ejection fraction) (HCC)  I50.20 Basic metabolic panel    Pro b natriuretic peptide (BNP)       ASSESSMENT AND PLAN: .    Patricia Mooney is a 67 y.o. female with hypertension, hyperlipidemia, type 2 DM, s/p Rt nephrectomy, CAD, HFrEF.   CAD: S/p LIMA-LAD (06/2023 at [facility]), and S/p mid Lcx/OM4 PCI (03/2023). Recommend DAPT with Aspirin/Brilinta at least till 03/2024.  Continue metoprolol succinate 12.5 mg daily. Continue  rosuvastatin 20 mg daily.  HFrEF: EF recovered to 50% in 06/2023. Continue metoprolol succinate 12.5 mg daily. In the past, she has not tolerated GDMT for HFrEF due to low blood pressure, but EF has recovered subsequently regardless.  AKI: Cr 2.27 (07/15/2023) up from 1.57 (9/15/20240. While this most to AKI, historically, patient's creatinine has been around 2-2.2 in the recent past. She only has 1 kidney, remains at risk for further decompensation. Check BMP, and proBNP today. Recommend regular follow-up with nephrology.  Mixed hyperlipidemia: LDL 111 in 03/2023.  Down to 72 in 05/2023. Continue Crestor 20 mg daily.   F/u in 3 months  Signed, Elder Negus, MD

## 2023-08-05 LAB — PRO B NATRIURETIC PEPTIDE: NT-Pro BNP: 852 pg/mL — ABNORMAL HIGH (ref 0–301)

## 2023-08-05 LAB — BASIC METABOLIC PANEL
BUN/Creatinine Ratio: 15 (ref 12–28)
BUN: 26 mg/dL (ref 8–27)
CO2: 20 mmol/L (ref 20–29)
Calcium: 9.6 mg/dL (ref 8.7–10.3)
Chloride: 107 mmol/L — ABNORMAL HIGH (ref 96–106)
Creatinine, Ser: 1.79 mg/dL — ABNORMAL HIGH (ref 0.57–1.00)
Glucose: 129 mg/dL — ABNORMAL HIGH (ref 70–99)
Potassium: 4.2 mmol/L (ref 3.5–5.2)
Sodium: 139 mmol/L (ref 134–144)
eGFR: 30 mL/min/{1.73_m2} — ABNORMAL LOW (ref >59)

## 2023-08-08 ENCOUNTER — Encounter (HOSPITAL_BASED_OUTPATIENT_CLINIC_OR_DEPARTMENT_OTHER): Payer: Self-pay

## 2023-08-08 ENCOUNTER — Other Ambulatory Visit (HOSPITAL_BASED_OUTPATIENT_CLINIC_OR_DEPARTMENT_OTHER): Payer: Self-pay

## 2023-08-08 ENCOUNTER — Emergency Department (HOSPITAL_BASED_OUTPATIENT_CLINIC_OR_DEPARTMENT_OTHER): Payer: Medicare (Managed Care)

## 2023-08-08 ENCOUNTER — Emergency Department (HOSPITAL_BASED_OUTPATIENT_CLINIC_OR_DEPARTMENT_OTHER)
Admission: EM | Admit: 2023-08-08 | Discharge: 2023-08-08 | Disposition: A | Discharge status: Home / Self Care | Payer: Medicare (Managed Care) | Attending: Emergency Medicine | Admitting: Emergency Medicine

## 2023-08-08 DIAGNOSIS — I5022 Chronic systolic (congestive) heart failure: Secondary | ICD-10-CM | POA: Diagnosis not present

## 2023-08-08 DIAGNOSIS — S0011XA Contusion of right eyelid and periocular area, initial encounter: Secondary | ICD-10-CM | POA: Insufficient documentation

## 2023-08-08 DIAGNOSIS — Z79899 Other long term (current) drug therapy: Secondary | ICD-10-CM | POA: Diagnosis not present

## 2023-08-08 DIAGNOSIS — E7800 Pure hypercholesterolemia, unspecified: Secondary | ICD-10-CM | POA: Diagnosis not present

## 2023-08-08 DIAGNOSIS — I252 Old myocardial infarction: Secondary | ICD-10-CM | POA: Diagnosis not present

## 2023-08-08 DIAGNOSIS — W19XXXA Unspecified fall, initial encounter: Secondary | ICD-10-CM | POA: Insufficient documentation

## 2023-08-08 DIAGNOSIS — I10 Essential (primary) hypertension: Secondary | ICD-10-CM | POA: Insufficient documentation

## 2023-08-08 DIAGNOSIS — I110 Hypertensive heart disease with heart failure: Secondary | ICD-10-CM | POA: Insufficient documentation

## 2023-08-08 DIAGNOSIS — E119 Type 2 diabetes mellitus without complications: Secondary | ICD-10-CM | POA: Insufficient documentation

## 2023-08-08 DIAGNOSIS — Z7982 Long term (current) use of aspirin: Secondary | ICD-10-CM | POA: Insufficient documentation

## 2023-08-08 DIAGNOSIS — H02843 Edema of right eye, unspecified eyelid: Secondary | ICD-10-CM | POA: Diagnosis not present

## 2023-08-08 DIAGNOSIS — S0990XA Unspecified injury of head, initial encounter: Secondary | ICD-10-CM | POA: Insufficient documentation

## 2023-08-08 DIAGNOSIS — Z7984 Long term (current) use of oral hypoglycemic drugs: Secondary | ICD-10-CM | POA: Insufficient documentation

## 2023-08-08 DIAGNOSIS — S0590XA Unspecified injury of unspecified eye and orbit, initial encounter: Secondary | ICD-10-CM | POA: Diagnosis present

## 2023-08-08 MED ORDER — TETRACAINE HCL 0.5 % OP SOLN
2.0000 [drp] | Freq: Once | OPHTHALMIC | Status: AC
  Administered 2023-08-08: 2 [drp] via OPHTHALMIC
  Filled 2023-08-08: qty 4

## 2023-08-08 MED ORDER — FLUORESCEIN SODIUM 1 MG OP STRP
1.0000 | ORAL_STRIP | Freq: Once | OPHTHALMIC | Status: AC
  Administered 2023-08-08: 1 via OPHTHALMIC
  Filled 2023-08-08: qty 1

## 2023-08-08 NOTE — ED Notes (Signed)
Dc instructions reviewed with patient. Patient voiced understanding. Dc with belongings.  °

## 2023-08-08 NOTE — ED Triage Notes (Signed)
She states she fell yesterday, striking her right eyelid area. Today her right eyelid is edematous and ecchymotic. She is alert and in no distress.

## 2023-08-08 NOTE — Discharge Instructions (Signed)
Thank you for allowing [place] to be a part of your care today.  Your CT scans did not show any broken bones in your face or your head.  For the swelling and bruising on your eyelid, I recommend continuing to use ice over the next 24 hours.  You may transition to warm compresses after that time.  Take [phone] mg of Tylenol as needed for pain.   Return to the ED if you develop sudden worsening of your symptoms or if you have new concerns.

## 2023-08-08 NOTE — ED Provider Notes (Signed)
[place] EMERGENCY DEPARTMENT AT [facility] Provider Note   CSN: [id number] Arrival date & time: 08/08/23  0736     History  Chief Complaint  Patient presents with   Eye Problem    Patricia Mooney is a 67 y.o. female with past medical history significant for type 2 diabetes, hypertension, hypercholesterolemia, NSTEMI, HFrEF presents to the ED complaining of right eyelid swelling and bruising after a fall yesterday.  Patient states that she fell striking her face and is unsure if she lost consciousness.  She applied heat and ice to the area yesterday, and reports the swelling today is somewhat better.  She denies any pain and states that she can see out of her right eye.  Denies blood thinner use, nausea, vomiting, headache, neck pain, dizziness, lightheadedness.         Home Medications Prior to Admission medications   Medication Sig Start Date End Date Taking? Authorizing Provider  acetaminophen (TYLENOL) 500 MG tablet Take 1,000 mg by mouth daily.    [provider]  ASPIRIN LOW DOSE 81 MG tablet Take 1 tablet (81 mg total) by mouth daily. 04/21/23   Patwardhan, Anabel Bene, MD  cholecalciferol (VITAMIN D) 1000 units tablet Take 1,000 Units by mouth daily.    [provider]  diclofenac Sodium (VOLTAREN) 1 % GEL Apply 1 g topically 3 (three) times daily. 03/31/23   [provider]  famotidine (PEPCID) 10 MG tablet Take by mouth.    [provider]  glipiZIDE (GLUCOTROL XL) 5 MG 24 hr tablet Take 5 mg by mouth in the morning and at bedtime.    [provider]  metFORMIN (GLUCOPHAGE) 1000 MG tablet Take 1 tablet (1,000 mg total) by mouth 2 (two) times daily with a meal. Restart on 10/10/2016 Patient not taking: Reported on 08/04/2023 10/09/16   Edsel Petrin, DO  metoprolol succinate (TOPROL-XL) 25 MG 24 hr tablet Take 0.5 tablets (12.5 mg total) by mouth every morning. Take with or immediately following a meal. 04/21/23 04/10/25   Patwardhan, Anabel Bene, MD  metoprolol tartrate (LOPRESSOR) 50 MG tablet Take 50 mg by mouth 2 (two) times daily. 07/05/23   [provider]  midodrine (PROAMATINE) 5 MG tablet Take 1 tablet (5 mg total) by mouth 3 (three) times daily while awake with food as needed for dizziness 04/10/23   Yates Decamp, MD  nitroGLYCERIN (NITROSTAT) 0.4 MG SL tablet Place 1 tablet (0.4 mg total) under the tongue every 5 (five) minutes as needed for chest pain. 04/21/23 08/04/23  Patwardhan, Manish J, MD  OZEMPIC, 0.25 OR 0.5 MG/DOSE, 2 MG/3ML SOPN Inject 0.25 mg into the skin once a week. Patient uses on Sunday Patient not taking: Reported on 08/04/2023 09/29/22   [provider]  rosuvastatin (CRESTOR) 40 MG tablet Take 1 tablet by mouth daily. 07/05/23 07/04/24  [provider]  ticagrelor (BRILINTA) 90 MG TABS tablet Take 1 tablet (90 mg total) by mouth 2 (two) times daily. 04/21/23   Patwardhan, Anabel Bene, MD  traMADol (ULTRAM) 50 MG tablet Take 50 mg by mouth every 6 (six) hours as needed. 07/30/23   [provider]  vitamin B-12 (CYANOCOBALAMIN) 100 MCG tablet Take 100 mcg by mouth daily.    [provider]      Allergies    Ramipril and Codeine    Review of Systems   Review of Systems  Eyes:  Negative for visual disturbance.  Musculoskeletal:  Negative for neck pain.  Skin:  Positive for wound (R eyelid).  Neurological:  Positive for syncope (possible). Negative for dizziness, light-headedness and headaches.    Physical Exam Updated Vital Signs BP (!) 140/74 (BP Location: Right Arm)   Pulse 85   Temp 98.4 F (36.9 C) (Oral)   Resp 16   SpO2 100%  Physical Exam Vitals and nursing note reviewed.  Constitutional:      General: She is not in acute distress.    Appearance: Normal appearance. She is not ill-appearing or diaphoretic.  HENT:     Head: Normocephalic. Contusion (R eyebrow and eyelid) present. No abrasion, right periorbital erythema, left periorbital  erythema or laceration.   Eyes:     General: Vision grossly intact.     Extraocular Movements: Extraocular movements intact.     Conjunctiva/sclera: Conjunctivae normal.     Pupils: Pupils are equal, round, and reactive to light.  Cardiovascular:     Rate and Rhythm: Normal rate and regular rhythm.  Pulmonary:     Effort: Pulmonary effort is normal.  Musculoskeletal:     Cervical back: Full passive range of motion without pain. No spinous process tenderness or muscular tenderness.  Skin:    General: Skin is warm and dry.     Capillary Refill: Capillary refill takes less than 2 seconds.  Neurological:     Mental Status: She is alert. Mental status is at baseline.  Psychiatric:        Mood and Affect: Mood normal.        Behavior: Behavior normal.     ED Results / Procedures / Treatments   Labs (all labs ordered are listed, but only abnormal results are displayed) Labs Reviewed - No data to display  EKG None  Radiology No results found.  Procedures Procedures    Medications Ordered in ED Medications - No data to display  ED Course/ Medical Decision Making/ A&P                                 Medical Decision Making  This patient presents to the ED with chief complaint(s) of facial injury after mechanical fall with non-contributory past medical history.  The complaint involves an extensive differential diagnosis and also carries with it a high risk of complications and morbidity.    The differential diagnosis includes orbital fracture, other maxillofacial fracture   Initial Assessment:   Exam significant for swelling and ecchymosis to the lateral right eyebrow and right eyelid.  No laceration or abrasion.  Minimal tenderness to palpation over the lateral right brow bone.  Vision is grossly intact.  EOM intact.  Sclera and conjunctive are clear.  Normal ROM of cervical spine without pain.  Independent interpretation of imaging: CT head an maxillofacial obtained.   No evidence of acute intracranial injury.  There is soft tissue swelling to the R eyelid.  No orbital wall fracture or entrapment.   Disposition:   Discussed supportive care measures for eyelid swelling with patient.  Advised patient to refrain from driving until swelling has improved and is no longer obstructing vision.  The patient has been appropriately medically screened and/or stabilized in the ED. I have low suspicion for any other emergent medical condition which would require further screening, evaluation or treatment in the ED or require inpatient management. At time of discharge the patient is hemodynamically stable and in no acute distress. I have discussed work-up results and diagnosis with patient and answered  all questions. Patient is agreeable with discharge plan. We discussed strict return precautions for returning to the emergency department and they verbalized understanding.            Final Clinical Impression(s) / ED Diagnoses Final diagnoses:  None    Rx / DC Orders ED Discharge Orders     None         Lenard Simmer, PA-C 08/08/23 1102    Alvira Monday, MD 08/10/23 2228

## 2023-08-21 ENCOUNTER — Telehealth: Payer: Self-pay | Admitting: Cardiology

## 2023-08-21 NOTE — Telephone Encounter (Signed)
Pt states that since having flu shot (rt arm) she developed swellinig in her left breast, only minimal on/off pain and redness but no other symptoms. Requesting cb

## 2023-08-21 NOTE — Telephone Encounter (Signed)
Spoke with patient and she states her left breast has been swollen since getting her flu shot. The pain is off and on.  Advised to contact PCP .

## 2023-08-25 ENCOUNTER — Telehealth: Payer: Self-pay | Admitting: Cardiology

## 2023-08-25 NOTE — Telephone Encounter (Signed)
I spoke with patient and gave her message from Dr Rosemary Holms

## 2023-08-25 NOTE — Telephone Encounter (Signed)
Needs to be evaluated by PCP/urgent care to evaluate for any infectious cause. Unlikely cardiac etiology.  Thanks MJP

## 2023-08-25 NOTE — Telephone Encounter (Signed)
Patient reports firmness/pressure in her left breast and she has had a temperature of 99.5 for a couple of weeks. She states that otherwise she is feeling good with not shortness of breath or chest pain. She recently called in with swelling in her left breast after receiving the flu shot. She was advised to call PCP at that time.  She had a CABG at [facility] in September and has also reached out to them for advice.

## 2023-08-25 NOTE — Telephone Encounter (Signed)
Pt states that she has been having pain where she just had a procedure on 9/9 and a fever and would like to speak with a nurse about this. Please advise

## 2023-09-14 ENCOUNTER — Telehealth: Payer: Self-pay | Admitting: Cardiology

## 2023-09-14 NOTE — Telephone Encounter (Signed)
*  STAT* If patient is at the pharmacy, call can be transferred to refill team.   1. Which medications need to be refilled? (please list name of each medication and dose if known)   metoprolol succinate (TOPROL-XL) 25 MG 24 hr tablet    2. Which pharmacy/location (including street and city if local pharmacy) is medication to be sent to?[facility] [address] - [place], [place] - [address]   3. Do they need a 30 day or 90 day supply? 90 day

## 2023-09-15 MED ORDER — METOPROLOL SUCCINATE ER 25 MG PO TB24: 12.5000 mg | ORAL_TABLET | ORAL | 3 refills | Status: DC

## 2023-09-15 NOTE — Telephone Encounter (Signed)
Rx sent to [facility] pharmacy

## 2023-10-12 ENCOUNTER — Telehealth: Payer: Self-pay | Admitting: Cardiology

## 2023-10-12 NOTE — Telephone Encounter (Signed)
Left the pt a message to call the office back. 

## 2023-10-12 NOTE — Telephone Encounter (Signed)
Tracey from Cardiac Rehab calling in regards to pt having sever leg pain that is causing her to have difficulty walking. She states that they think that it may be coming from the "Statin" that pt is taking. Office would like for pt to be called regarding this matter. Please advise

## 2023-10-13 ENCOUNTER — Other Ambulatory Visit: Payer: Self-pay

## 2023-10-13 ENCOUNTER — Observation Stay (HOSPITAL_COMMUNITY)
Admission: EM | Admit: 2023-10-13 | Discharge: 2023-10-14 | Disposition: A | Discharge status: Home / Self Care | Payer: Medicare (Managed Care) | Attending: Internal Medicine | Admitting: Internal Medicine

## 2023-10-13 ENCOUNTER — Encounter (HOSPITAL_COMMUNITY): Payer: Self-pay | Admitting: Emergency Medicine

## 2023-10-13 ENCOUNTER — Inpatient Hospital Stay (HOSPITAL_COMMUNITY): Payer: Medicare (Managed Care)

## 2023-10-13 ENCOUNTER — Ambulatory Visit (HOSPITAL_COMMUNITY)
Admission: EM | Admit: 2023-10-13 | Discharge: 2023-10-13 | Disposition: A | Discharge status: Home / Self Care | Payer: Medicare (Managed Care) | Source: Home / Self Care

## 2023-10-13 ENCOUNTER — Encounter (HOSPITAL_COMMUNITY): Payer: Self-pay

## 2023-10-13 DIAGNOSIS — D649 Anemia, unspecified: Secondary | ICD-10-CM | POA: Insufficient documentation

## 2023-10-13 DIAGNOSIS — N1832 Chronic kidney disease, stage 3b: Secondary | ICD-10-CM | POA: Insufficient documentation

## 2023-10-13 DIAGNOSIS — E7849 Other hyperlipidemia: Secondary | ICD-10-CM

## 2023-10-13 DIAGNOSIS — Z955 Presence of coronary angioplasty implant and graft: Secondary | ICD-10-CM | POA: Insufficient documentation

## 2023-10-13 DIAGNOSIS — M6282 Rhabdomyolysis: Principal | ICD-10-CM

## 2023-10-13 DIAGNOSIS — I130 Hypertensive heart and chronic kidney disease with heart failure and stage 1 through stage 4 chronic kidney disease, or unspecified chronic kidney disease: Secondary | ICD-10-CM | POA: Insufficient documentation

## 2023-10-13 DIAGNOSIS — Z79899 Other long term (current) drug therapy: Secondary | ICD-10-CM | POA: Insufficient documentation

## 2023-10-13 DIAGNOSIS — N184 Chronic kidney disease, stage 4 (severe): Secondary | ICD-10-CM | POA: Diagnosis not present

## 2023-10-13 DIAGNOSIS — R7401 Elevation of levels of liver transaminase levels: Secondary | ICD-10-CM

## 2023-10-13 DIAGNOSIS — G959 Disease of spinal cord, unspecified: Secondary | ICD-10-CM

## 2023-10-13 DIAGNOSIS — I2510 Atherosclerotic heart disease of native coronary artery without angina pectoris: Secondary | ICD-10-CM | POA: Insufficient documentation

## 2023-10-13 DIAGNOSIS — G609 Hereditary and idiopathic neuropathy, unspecified: Secondary | ICD-10-CM

## 2023-10-13 DIAGNOSIS — I951 Orthostatic hypotension: Secondary | ICD-10-CM | POA: Diagnosis not present

## 2023-10-13 DIAGNOSIS — N189 Chronic kidney disease, unspecified: Secondary | ICD-10-CM

## 2023-10-13 DIAGNOSIS — E119 Type 2 diabetes mellitus without complications: Secondary | ICD-10-CM

## 2023-10-13 DIAGNOSIS — E1122 Type 2 diabetes mellitus with diabetic chronic kidney disease: Secondary | ICD-10-CM | POA: Insufficient documentation

## 2023-10-13 DIAGNOSIS — Z7984 Long term (current) use of oral hypoglycemic drugs: Secondary | ICD-10-CM | POA: Diagnosis not present

## 2023-10-13 DIAGNOSIS — D638 Anemia in other chronic diseases classified elsewhere: Secondary | ICD-10-CM

## 2023-10-13 DIAGNOSIS — Z951 Presence of aortocoronary bypass graft: Secondary | ICD-10-CM | POA: Diagnosis not present

## 2023-10-13 DIAGNOSIS — N179 Acute kidney failure, unspecified: Secondary | ICD-10-CM | POA: Diagnosis not present

## 2023-10-13 DIAGNOSIS — M79604 Pain in right leg: Secondary | ICD-10-CM | POA: Insufficient documentation

## 2023-10-13 DIAGNOSIS — Z8679 Personal history of other diseases of the circulatory system: Secondary | ICD-10-CM

## 2023-10-13 DIAGNOSIS — M79605 Pain in left leg: Secondary | ICD-10-CM | POA: Insufficient documentation

## 2023-10-13 DIAGNOSIS — I5020 Unspecified systolic (congestive) heart failure: Secondary | ICD-10-CM | POA: Diagnosis present

## 2023-10-13 DIAGNOSIS — I5022 Chronic systolic (congestive) heart failure: Secondary | ICD-10-CM | POA: Diagnosis not present

## 2023-10-13 DIAGNOSIS — Z7982 Long term (current) use of aspirin: Secondary | ICD-10-CM | POA: Diagnosis not present

## 2023-10-13 DIAGNOSIS — G629 Polyneuropathy, unspecified: Secondary | ICD-10-CM

## 2023-10-13 DIAGNOSIS — E785 Hyperlipidemia, unspecified: Secondary | ICD-10-CM | POA: Insufficient documentation

## 2023-10-13 DIAGNOSIS — R7989 Other specified abnormal findings of blood chemistry: Secondary | ICD-10-CM | POA: Diagnosis present

## 2023-10-13 DIAGNOSIS — I10 Essential (primary) hypertension: Secondary | ICD-10-CM | POA: Diagnosis present

## 2023-10-13 LAB — CBC WITH DIFFERENTIAL/PLATELET
Abs Immature Granulocytes: 0.04 10*3/uL (ref 0.00–0.07)
Abs Immature Granulocytes: 0.05 10*3/uL (ref 0.00–0.07)
Basophils Absolute: 0 10*3/uL (ref 0.0–0.1)
Basophils Absolute: 0 10*3/uL (ref 0.0–0.1)
Basophils Relative: 1 %
Basophils Relative: 0 %
Eosinophils Absolute: 0.1 10*3/uL (ref 0.0–0.5)
Eosinophils Absolute: 0.1 10*3/uL (ref 0.0–0.5)
Eosinophils Relative: 1 %
Eosinophils Relative: 2 %
HCT: 30.7 % — ABNORMAL LOW (ref 36.0–46.0)
HCT: 29.3 % — ABNORMAL LOW (ref 36.0–46.0)
Hemoglobin: 9.6 g/dL — ABNORMAL LOW (ref 12.0–15.0)
Hemoglobin: 8.9 g/dL — ABNORMAL LOW (ref 12.0–15.0)
Immature Granulocytes: 1 %
Immature Granulocytes: 1 %
Lymphocytes Relative: 25 %
Lymphocytes Relative: 23 %
Lymphs Abs: 1.5 10*3/uL (ref 0.7–4.0)
Lymphs Abs: 1.5 10*3/uL (ref 0.7–4.0)
MCH: 26 pg (ref 26.0–34.0)
MCH: 25.8 pg — ABNORMAL LOW (ref 26.0–34.0)
MCHC: 31.3 g/dL (ref 30.0–36.0)
MCHC: 30.4 g/dL (ref 30.0–36.0)
MCV: 83.2 fL (ref 80.0–100.0)
MCV: 84.9 fL (ref 80.0–100.0)
Monocytes Absolute: 0.4 10*3/uL (ref 0.1–1.0)
Monocytes Absolute: 0.6 10*3/uL (ref 0.1–1.0)
Monocytes Relative: 7 %
Monocytes Relative: 9 %
Neutro Abs: 3.9 10*3/uL (ref 1.7–7.7)
Neutro Abs: 4.4 10*3/uL (ref 1.7–7.7)
Neutrophils Relative %: 65 %
Neutrophils Relative %: 65 %
Platelets: 304 10*3/uL (ref 150–400)
Platelets: 277 10*3/uL (ref 150–400)
RBC: 3.69 MIL/uL — ABNORMAL LOW (ref 3.87–5.11)
RBC: 3.45 MIL/uL — ABNORMAL LOW (ref 3.87–5.11)
RDW: 16.4 % — ABNORMAL HIGH (ref 11.5–15.5)
RDW: 16.4 % — ABNORMAL HIGH (ref 11.5–15.5)
WBC: 6 10*3/uL (ref 4.0–10.5)
WBC: 6.8 10*3/uL (ref 4.0–10.5)
nRBC: 0 % (ref 0.0–0.2)
nRBC: 0 % (ref 0.0–0.2)

## 2023-10-13 LAB — COMPREHENSIVE METABOLIC PANEL
ALT: 237 U/L — ABNORMAL HIGH (ref 0–44)
AST: 371 U/L — ABNORMAL HIGH (ref 15–41)
Albumin: 3.1 g/dL — ABNORMAL LOW (ref 3.5–5.0)
Alkaline Phosphatase: 64 U/L (ref 38–126)
Anion gap: 11 (ref 5–15)
BUN: 47 mg/dL — ABNORMAL HIGH (ref 8–23)
CO2: 19 mmol/L — ABNORMAL LOW (ref 22–32)
Calcium: 9.9 mg/dL (ref 8.9–10.3)
Chloride: 108 mmol/L (ref 98–111)
Creatinine, Ser: 3.04 mg/dL — ABNORMAL HIGH (ref 0.44–1.00)
GFR, Estimated: 16 mL/min — ABNORMAL LOW (ref >60)
Glucose, Bld: 152 mg/dL — ABNORMAL HIGH (ref 70–99)
Potassium: 4 mmol/L (ref 3.5–5.1)
Sodium: 138 mmol/L (ref 135–145)
Total Bilirubin: 0.6 mg/dL (ref <1.2)
Total Protein: 8.3 g/dL — ABNORMAL HIGH (ref 6.5–8.1)

## 2023-10-13 LAB — URINALYSIS, ROUTINE W REFLEX MICROSCOPIC
Bilirubin Urine: NEGATIVE
Glucose, UA: NEGATIVE mg/dL
Ketones, ur: NEGATIVE mg/dL
Leukocytes,Ua: NEGATIVE
Nitrite: NEGATIVE
Protein, ur: 100 mg/dL — AB
Specific Gravity, Urine: 1.014 (ref 1.005–1.030)
pH: 5 (ref 5.0–8.0)

## 2023-10-13 LAB — BASIC METABOLIC PANEL
Anion gap: 9 (ref 5–15)
BUN: 49 mg/dL — ABNORMAL HIGH (ref 8–23)
CO2: 19 mmol/L — ABNORMAL LOW (ref 22–32)
Calcium: 9.5 mg/dL (ref 8.9–10.3)
Chloride: 112 mmol/L — ABNORMAL HIGH (ref 98–111)
Creatinine, Ser: 3.03 mg/dL — ABNORMAL HIGH (ref 0.44–1.00)
GFR, Estimated: 16 mL/min — ABNORMAL LOW (ref >60)
Glucose, Bld: 152 mg/dL — ABNORMAL HIGH (ref 70–99)
Potassium: 3.9 mmol/L (ref 3.5–5.1)
Sodium: 140 mmol/L (ref 135–145)

## 2023-10-13 LAB — FOLATE: Folate: 10 ng/mL (ref >5.9)

## 2023-10-13 LAB — IRON AND TIBC
Iron: 44 ug/dL (ref 28–170)
Saturation Ratios: 17 % (ref 10.4–31.8)
TIBC: 266 ug/dL (ref 250–450)
UIBC: 222 ug/dL

## 2023-10-13 LAB — GLUCOSE, CAPILLARY: Glucose-Capillary: 75 mg/dL (ref 70–99)

## 2023-10-13 LAB — RETICULOCYTES
Immature Retic Fract: 9.2 % (ref 2.3–15.9)
RBC.: 3.29 MIL/uL — ABNORMAL LOW (ref 3.87–5.11)
Retic Count, Absolute: 27.3 10*3/uL (ref 19.0–186.0)
Retic Ct Pct: 0.8 % (ref 0.4–3.1)

## 2023-10-13 LAB — C-REACTIVE PROTEIN: CRP: 1.5 mg/dL — ABNORMAL HIGH (ref <1.0)

## 2023-10-13 LAB — HEPATITIS PANEL, ACUTE
HCV Ab: NONREACTIVE
Hep A IgM: NONREACTIVE
Hep B C IgM: NONREACTIVE
Hepatitis B Surface Ag: NONREACTIVE

## 2023-10-13 LAB — MRSA NEXT GEN BY PCR, NASAL: MRSA by PCR Next Gen: NOT DETECTED

## 2023-10-13 LAB — HIV ANTIBODY (ROUTINE TESTING W REFLEX): HIV Screen 4th Generation wRfx: NONREACTIVE

## 2023-10-13 LAB — SEDIMENTATION RATE: Sed Rate: 131 mm/h — ABNORMAL HIGH (ref 0–22)

## 2023-10-13 LAB — CK: Total CK: 8213 U/L — ABNORMAL HIGH (ref 38–234)

## 2023-10-13 LAB — D-DIMER, QUANTITATIVE: D-Dimer, Quant: 1.53 ug{FEU}/mL — ABNORMAL HIGH (ref 0.00–0.50)

## 2023-10-13 LAB — FERRITIN: Ferritin: 202 ng/mL (ref 11–307)

## 2023-10-13 LAB — VITAMIN B12: Vitamin B-12: 1135 pg/mL — ABNORMAL HIGH (ref 180–914)

## 2023-10-13 MED ORDER — SODIUM CHLORIDE 0.9 % IV BOLUS
1000.0000 mL | Freq: Once | INTRAVENOUS | Status: AC
  Administered 2023-10-13: 1000 mL via INTRAVENOUS

## 2023-10-13 MED ORDER — METOPROLOL TARTRATE 25 MG PO TABS
25.0000 mg | ORAL_TABLET | Freq: Two times a day (BID) | ORAL | Status: DC
  Administered 2023-10-13 – 2023-10-14 (×2): 25 mg via ORAL
  Filled 2023-10-13 (×2): qty 1

## 2023-10-13 MED ORDER — HEPARIN SODIUM (PORCINE) 5000 UNIT/ML IJ SOLN
5000.0000 [IU] | Freq: Three times a day (TID) | INTRAMUSCULAR | Status: DC
  Administered 2023-10-13 – 2023-10-14 (×2): 5000 [IU] via SUBCUTANEOUS
  Filled 2023-10-13 (×2): qty 1

## 2023-10-13 MED ORDER — INSULIN ASPART 100 UNIT/ML IJ SOLN: 0.0000 [IU] | Freq: Three times a day (TID) | INTRAMUSCULAR | Status: DC

## 2023-10-13 MED ORDER — TICAGRELOR 90 MG PO TABS: 90.0000 mg | ORAL_TABLET | Freq: Two times a day (BID) | ORAL | Status: DC

## 2023-10-13 MED ORDER — ONDANSETRON HCL 4 MG/2ML IJ SOLN: 4.0000 mg | Freq: Four times a day (QID) | INTRAMUSCULAR | Status: DC | PRN

## 2023-10-13 MED ORDER — SODIUM CHLORIDE 0.9% FLUSH: 3.0000 mL | INTRAVENOUS | Status: DC | PRN

## 2023-10-13 MED ORDER — TICAGRELOR 90 MG PO TABS
90.0000 mg | ORAL_TABLET | Freq: Two times a day (BID) | ORAL | Status: DC
  Administered 2023-10-14: 90 mg via ORAL
  Filled 2023-10-13: qty 1

## 2023-10-13 MED ORDER — ASPIRIN 81 MG PO TBEC
81.0000 mg | DELAYED_RELEASE_TABLET | Freq: Every day | ORAL | Status: DC
  Administered 2023-10-14: 81 mg via ORAL
  Filled 2023-10-13: qty 1

## 2023-10-13 MED ORDER — ONDANSETRON HCL 4 MG PO TABS: 4.0000 mg | ORAL_TABLET | Freq: Four times a day (QID) | ORAL | Status: DC | PRN

## 2023-10-13 MED ORDER — SODIUM CHLORIDE 0.9% FLUSH
3.0000 mL | Freq: Two times a day (BID) | INTRAVENOUS | Status: DC
  Administered 2023-10-14: 3 mL via INTRAVENOUS

## 2023-10-13 MED ORDER — LACTATED RINGERS IV SOLN: INTRAVENOUS | Status: DC

## 2023-10-13 MED ORDER — INSULIN ASPART 100 UNIT/ML IJ SOLN: 0.0000 [IU] | Freq: Every day | INTRAMUSCULAR | Status: DC

## 2023-10-13 MED ORDER — SODIUM CHLORIDE 0.9 % IV SOLN
250.0000 mL | INTRAVENOUS | Status: DC | PRN
  Administered 2023-10-13: 250 mL via INTRAVENOUS

## 2023-10-13 MED ORDER — SODIUM CHLORIDE 0.9% FLUSH: 3.0000 mL | Freq: Two times a day (BID) | INTRAVENOUS | Status: DC

## 2023-10-13 MED ORDER — SODIUM CHLORIDE 0.9 % IV BOLUS
500.0000 mL | Freq: Once | INTRAVENOUS | Status: AC
  Administered 2023-10-13: 500 mL via INTRAVENOUS

## 2023-10-13 MED ORDER — SENNOSIDES-DOCUSATE SODIUM 8.6-50 MG PO TABS: 1.0000 | ORAL_TABLET | Freq: Every evening | ORAL | Status: DC | PRN

## 2023-10-13 NOTE — Discharge Instructions (Signed)
Because you are currently taking Brilinta, the chances of your having a blood clot in your leg are incredibly low.  I also do not feel that you are experience a blood clot because you are having generalized leg pain in both legs, not a specific single pain in 1 particular area of either leg.  The results of your laboratory analysis will hopefully be available in the next few hours.  We will contact you by phone with those results and provide you with any further recommendations based on those results.  In the meantime, please be sure you are drinking plenty of water and staying well-hydrated.  Thank you for visiting [facility] today.  We will be in touch soon.

## 2023-10-13 NOTE — ED Provider Notes (Signed)
[facility]    CSN: [id number] Arrival date & time: 10/13/23  1054    HISTORY   Chief Complaint  Patient presents with   Leg Pain   HPI Patricia Mooney is a pleasant, 67 y.o. female who presents to urgent care today. Pt c/o bilateral upper and lower leg pain for the past 4 days.  Reports concern for blood clot but does not endorse any focal pain, swelling or erythema of either leg.  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  Patient states she has also been participating in cardiac rehab after having had coronary artery bypass grafting procedure about 6 months ago.  The history is provided by the patient.   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   AKI (acute kidney injury) (HCC) 05/15/2023   NSTEMI (non-ST elevated myocardial infarction) (HCC) 04/04/2023   HFrEF (heart failure with reduced ejection fraction) (HCC) 04/04/2023   Periorbital cellulitis of right eye 10/07/2016   Essential hypertension 12/13/2014   Pure hypercholesterolemia 12/13/2014   Type 2 diabetes mellitus with hyperglycemia (HCC) 12/07/2014   Past Surgical History:  Procedure Laterality Date   ABDOMINAL HYSTERECTOMY  1980   CESAREAN SECTION  1975; 1978   CORONARY STENT INTERVENTION N/A 04/09/2023   Procedure: CORONARY STENT INTERVENTION;  Surgeon: Elder Negus, MD;  Location: [facility];  Service: Cardiovascular;  Laterality: N/A;   CORONARY ULTRASOUND/IVUS N/A 04/09/2023   Procedure: Coronary Ultrasound/IVUS;  Surgeon: Elder Negus, MD;  Location: [facility];  Service: Cardiovascular;  Laterality: N/A;   NEPHRECTOMY Right 1980   "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    Home Medications    Prior to Admission medications   Medication Sig Start Date End Date Taking? Authorizing Provider  acetaminophen (TYLENOL) 500 MG tablet Take 1,000 mg by mouth daily.    [provider]  ASPIRIN LOW DOSE 81 MG tablet Take 1 tablet (81 mg total) by mouth daily. 04/21/23   Patwardhan, Anabel Bene, MD  cholecalciferol (VITAMIN D) 1000 units tablet Take 1,000 Units by mouth daily.    [provider]  diclofenac Sodium (VOLTAREN) 1 % GEL Apply 1 g topically 3 (three) times daily. 03/31/23   [provider]  famotidine (PEPCID) 10 MG tablet Take by mouth.    [provider]  glipiZIDE (GLUCOTROL XL) 5 MG 24 hr tablet Take 5 mg by mouth in the morning and at bedtime.    [provider]  metFORMIN (GLUCOPHAGE) 1000 MG tablet Take 1 tablet (1,000 mg total) by mouth 2 (two) times daily with a meal. Restart on 10/10/2016 Patient not taking: Reported on 08/04/2023 10/09/16   Edsel Petrin, DO  metoprolol succinate (TOPROL-XL) 25 MG 24 hr tablet Take 0.5 tablets (12.5 mg total) by mouth every morning. Take with or immediately following a meal. 09/15/23 09/04/25  Patwardhan, [employer],  MD  metoprolol tartrate (LOPRESSOR) 50 MG tablet Take 50 mg by mouth 2 (two) times daily. 07/05/23   [provider]  midodrine (PROAMATINE) 5 MG tablet Take 1 tablet (5 mg total) by mouth 3 (three) times daily while awake with food as needed for dizziness 04/10/23   Yates Decamp, MD  nitroGLYCERIN (NITROSTAT) 0.4 MG SL tablet Place 1 tablet (0.4 mg total) under the tongue every 5 (five) minutes as needed for chest pain. 04/21/23 08/04/23  Patwardhan, Manish J, MD  OZEMPIC, 0.25  OR 0.5 MG/DOSE, 2 MG/3ML SOPN Inject 0.25 mg into the skin once a week. Patient uses on Sunday Patient not taking: Reported on 08/04/2023 09/29/22   [provider]  rosuvastatin (CRESTOR) 40 MG tablet Take 1 tablet by mouth daily. 07/05/23 07/04/24  [provider]  ticagrelor (BRILINTA) 90 MG TABS tablet Take 1 tablet (90 mg total) by mouth 2 (two) times daily. 04/21/23   Patwardhan, Anabel Bene, MD  traMADol (ULTRAM) 50 MG tablet Take 50 mg by mouth every 6 (six) hours as needed. 07/30/23   [provider]  vitamin B-12 (CYANOCOBALAMIN) 100 MCG tablet Take 100 mcg by mouth daily.    [provider]    Family History History reviewed. No pertinent family history. Social History Social History   Tobacco Use   Smoking status: Never   Smokeless tobacco: Never  Substance Use Topics   Alcohol use: No   Drug use: No   Allergies   Ramipril and Codeine  Review of Systems Review of Systems Pertinent findings revealed after performing a 14 point review of systems has been noted in the history of present illness.  Physical Exam Vital Signs BP (!) 144/82   Pulse 85   Temp 98.2 F (36.8 C) (Oral)   Resp 16   SpO2 97%   No data found.  Physical Exam Vitals and nursing note reviewed.  Constitutional:      General: She is not in acute distress.    Appearance: Normal appearance.  HENT:     Head: Normocephalic and atraumatic.  Eyes:     Pupils: Pupils are equal, round, and reactive to light.  Cardiovascular:     Rate and Rhythm: Normal rate and regular rhythm.     Pulses: Normal pulses.     Heart sounds: Normal heart sounds.  Pulmonary:     Effort: Pulmonary effort is normal.     Breath sounds: Normal breath sounds.  Musculoskeletal:        General: No swelling, tenderness, deformity or signs of injury. Normal range of motion.     Cervical back: Normal range of motion and neck supple.     Right lower leg: No edema.     Left lower leg: No edema.   Skin:    General: Skin is warm and dry.  Neurological:     General: No focal deficit present.     Mental Status: She is alert and oriented to person, place, and time. Mental status is at baseline.  Psychiatric:        Mood and Affect: Mood normal.        Behavior: Behavior normal.        Thought Content: Thought content normal.        Judgment: Judgment normal.     Visual Acuity Right Eye Distance:   Left Eye Distance:   Bilateral Distance:    Right Eye Near:   Left Eye Near:    Bilateral Near:  UC Couse / Diagnostics / Procedures:     Radiology No results found.  Procedures Procedures (including critical care time) EKG  Pending results:  Labs Reviewed  CBC WITH DIFFERENTIAL/PLATELET  COMPREHENSIVE METABOLIC PANEL  C-REACTIVE PROTEIN  D-DIMER, QUANTITATIVE  SEDIMENTATION RATE    Medications Ordered in UC: Medications - No data to display  UC Diagnoses / Final Clinical Impressions(s)   I have reviewed the triage vital signs and the nursing notes.  Pertinent labs & imaging results that were available during my care of the patient were reviewed by me and considered in my medical decision making (see chart for details).    Final diagnoses:  Bilateral leg pain  Stage 3b chronic kidney disease (HCC)   Patient advised that because she is currently taking Brilinta, due to having had cardiac stent placed prior to CABG, the likelihood of blood clot is very low especially since she does not have any focal pain at this time.  Will perform stat labs to rule out rhabdomyolysis as below.  Will contact patient with results once received and provide further recommendations based on those results.  Please see discharge instructions below for details of plan of care as provided to patient. ED Prescriptions   None    PDMP not reviewed this encounter.  Pending results:  Labs Reviewed  CBC WITH DIFFERENTIAL/PLATELET  COMPREHENSIVE METABOLIC PANEL  C-REACTIVE PROTEIN   D-DIMER, QUANTITATIVE  SEDIMENTATION RATE      Discharge Instructions      Because you are currently taking Brilinta, the chances of your having a blood clot in your leg are incredibly low.  I also do not feel that you are experience a blood clot because you are having generalized leg pain in both legs, not a specific single pain in 1 particular area of either leg.  The results of your laboratory analysis will hopefully be available in the next few hours.  We will contact you by phone with those results and provide you with any further recommendations based on those results.  In the meantime, please be sure you are drinking plenty of water and staying well-hydrated.  Thank you for visiting [facility] today.  We will be in touch soon.      Disposition Upon Discharge:  Condition: stable for discharge home  Patient presented with an acute illness with associated systemic symptoms and significant discomfort requiring urgent management. In my opinion, this is a condition that a prudent lay person (someone who possesses an average knowledge of health and medicine) may potentially expect to result in complications if not addressed urgently such as respiratory distress, impairment of bodily function or dysfunction of bodily organs.   Routine symptom specific, illness specific and/or disease specific instructions were discussed with the patient and/or caregiver at length.   As such, the patient has been evaluated and assessed, work-up was performed and treatment was provided in alignment with urgent care protocols and evidence based medicine.  Patient/parent/caregiver has been advised that the patient may require follow up for further testing and treatment if the symptoms continue in spite of treatment, as clinically indicated and appropriate.  Patient/parent/caregiver has been advised to return to the [facility] or PCP if no better; to PCP or the Emergency Department if new signs and  symptoms develop, or if the current signs or symptoms continue to change or worsen for further workup, evaluation and treatment as clinically indicated and appropriate  The patient will follow up with their current PCP if and  as advised. If the patient does not currently have a PCP we will assist them in obtaining one.   The patient may need specialty follow up if the symptoms continue, in spite of conservative treatment and management, for further workup, evaluation, consultation and treatment as clinically indicated and appropriate.  Patient/parent/caregiver verbalized understanding and agreement of plan as discussed.  All questions were addressed during visit.  Please see discharge instructions below for further details of plan.  This office note has been dictated using Teaching laboratory technician.  Unfortunately, this method of dictation can sometimes lead to typographical or grammatical errors.  I apologize for your inconvenience in advance if this occurs.  Please do not hesitate to reach out to me if clarification is needed.      Theadora Rama Scales, PA-C 10/13/23 1156

## 2023-10-13 NOTE — ED Triage Notes (Addendum)
Pt c/o leg pain in both legs for 4 days. States she was told to come in by cardiologist.

## 2023-10-13 NOTE — H&P (Addendum)
History and Physical    IZEBELLA AAKRE KVQ:[id number] DOB: Nov 04, 1955 DOA: 10/13/2023  PCP: Lavonda Jumbo, PA-C   Patient coming from: Home   Chief Complaint:  Chief Complaint  Patient presents with   Abnormal Lab   ED TRIAGE note:Pt sent by UC for elevated liver labs and elevated kidney labs. Pt c/o pain in legs bilatx1wk.   HPI:  Patricia Mooney is a 67 y.o. female with medical history significant of ischemic cardiomyopathy, CAD status post CABG 06/29/2023 currently doing cardiac rehab,, chronic systolic heart failure EF 25 to 30%, hypertension, hypercholesteremia, CKD stage IIIb, chronic orthostatic hypotension due to diabetic peripheral neuropathy and non-insulin-dependent DM type II. She has been presented to emergency department referred from urgent care today as she was having leg cramping.  Patient stated that she is able to walk but it is hard for her to walk.  It has been ongoing for 1 week.  Patient also has been going for cardiac rehab and thought it was related to her exercise.  At the urgent care she had labs and which are abnormal. Patient reported that since her CABG she is on high-dose cholesterol medication which is causing her leg cramping and feeling sensation of muscle weakness of her leg.  Currently undergoing cardiac rehab. Patient denies any injury, fall, chest pain, shortness of breath and palpitation.   ED Course:  At presentation to ED patient blood pressure found elevated 158/73.  Otherwise hemodynamically stable. Lab work, CBC showing low hemoglobin 8.9 baseline hemoglobin around 9.6-11.3 otherwise unremarkable.  BMP showing elevated creatinine 3.03 (baseline creatinine around 1.7-2), low bicarb 19, elevated BUN 16. CMP in the urgent care showed elevated AST 371, elevated ALT 237, normal alkaline phosphatase 64.  Normal bilirubin. Elevated C-reactive protein 1.5, elevated D-dimer 1.53. Elevated CK 8213. UA hazy appearance, protein 100, rare bacteria and  hemoglobin dipstick positive.  In the ED patient has been resuscitated with 1 L of NS bolus.  ED physician Dr. Lynelle Doctor concern for patient has transaminitis, rhabdomyolysis in the context of Crestor use and rhabdomyolysis also contributing to AKI.  Hospitalist has been contacted for further management of AKI, transaminitis and rhabdomyolysis.  Significant labs in the ED: Lab Orders         Basic metabolic panel         CBC with Differential         Urinalysis, Routine w reflex microscopic -Urine, Clean Catch         CK         HIV Antibody (routine testing w rflx)         Sodium, urine, random         Creatinine, urine, random         Hepatitis panel, acute         CK         Comprehensive metabolic panel         CBC         Hemoglobin A1c         Occult blood card to lab, stool         Vitamin B12         Folate         Iron and TIBC         Ferritin         Reticulocytes       Review of Systems:  Review of Systems  Constitutional:  Positive for malaise/fatigue. Negative for chills, fever and weight  loss.  Respiratory:  Negative for cough and shortness of breath.   Cardiovascular:  Negative for chest pain and palpitations.  Gastrointestinal:  Negative for heartburn and nausea.  Musculoskeletal:  Positive for myalgias. Negative for back pain, falls, joint pain and neck pain.  Skin:  Negative for itching and rash.  Neurological:  Negative for dizziness and headaches.  Endo/Heme/Allergies:  Does not bruise/bleed easily.  Psychiatric/Behavioral:  The patient is not nervous/anxious.     Past Medical History:  Diagnosis Date   Hypercholesteremia    Hypertension    Periorbital edema of right eye admitted 10/07/2016   Type II diabetes mellitus (HCC)     Past Surgical History:  Procedure Laterality Date   ABDOMINAL HYSTERECTOMY  1980   CESAREAN SECTION  1975; 1978   CORONARY STENT INTERVENTION N/A 04/09/2023   Procedure: CORONARY STENT INTERVENTION;  Surgeon: Elder Negus, MD;  Location: [facility];  Service: Cardiovascular;  Laterality: N/A;   CORONARY ULTRASOUND/IVUS N/A 04/09/2023   Procedure: Coronary Ultrasound/IVUS;  Surgeon: Elder Negus, MD;  Location: [facility];  Service: Cardiovascular;  Laterality: N/A;   NEPHRECTOMY Right 1980   "said I was born w/one that didn't work"   RIGHT/LEFT HEART CATH AND CORONARY ANGIOGRAPHY N/A 04/04/2023   Procedure: RIGHT/LEFT HEART CATH AND CORONARY ANGIOGRAPHY;  Surgeon: Elder Negus, MD;  Location: [facility];  Service: Cardiovascular;  Laterality: N/A;     reports that she has never smoked. She has never used smokeless tobacco. She reports that she does not drink alcohol and does not use drugs.  Allergies  Allergen Reactions   Ramipril Swelling    Facial swelling after 1 pill.   Codeine Nausea Only    History reviewed. No pertinent family history.  Prior to Admission medications   Medication Sig Start Date End Date Taking? Authorizing Provider  acetaminophen (TYLENOL) 500 MG tablet Take 1,000 mg by mouth every 8 (eight) hours as needed for moderate pain (pain score 4-6).   Yes [provider]  ASPIRIN LOW DOSE 81 MG tablet Take 1 tablet (81 mg total) by mouth daily. 04/21/23  Yes Patwardhan, Manish J, MD  cholecalciferol (VITAMIN D) 1000 units tablet Take 1,000 Units by mouth daily.   Yes [provider]  diclofenac Sodium (VOLTAREN) 1 % GEL Apply 1 g topically 3 (three) times daily. 03/31/23  Yes [provider]  famotidine (PEPCID) 10 MG tablet Take 10 mg by mouth daily.   Yes [provider]  glipiZIDE (GLUCOTROL XL) 5 MG 24 hr tablet Take 5 mg by mouth at bedtime.   Yes [provider]  metoprolol succinate (TOPROL-XL) 25 MG 24 hr tablet Take 0.5 tablets (12.5 mg total) by mouth every morning. Take with or immediately following a meal. 09/15/23 09/04/25 Yes Patwardhan, Manish J, MD  metoprolol tartrate (LOPRESSOR) 25  MG tablet Take 25 mg by mouth 2 (two) times daily.   Yes [provider]  midodrine (PROAMATINE) 5 MG tablet Take 1 tablet (5 mg total) by mouth 3 (three) times daily while awake with food as needed for dizziness 04/10/23  Yes Yates Decamp, MD  nitroGLYCERIN (NITROSTAT) 0.4 MG SL tablet Place 1 tablet (0.4 mg total) under the tongue every 5 (five) minutes as needed for chest pain. 04/21/23 10/13/23 Yes Patwardhan, Manish J, MD  rosuvastatin (CRESTOR) 40 MG tablet Take 1 tablet by mouth daily. 07/05/23 07/04/24 Yes [provider]  ticagrelor (BRILINTA) 90 MG TABS tablet  Take 1 tablet (90 mg total) by mouth 2 (two) times daily. 04/21/23  Yes Patwardhan, Anabel Bene, MD  traMADol (ULTRAM) 50 MG tablet Take 50 mg by mouth every 6 (six) hours as needed for moderate pain (pain score 4-6). 07/30/23  Yes [provider]  vitamin B-12 (CYANOCOBALAMIN) 100 MCG tablet Take 100 mcg by mouth daily.   Yes [provider]  amoxicillin-clavulanate (AUGMENTIN) 875-125 MG tablet Take 1 tablet by mouth 2 (two) times daily. Patient not taking: Reported on 10/13/2023 09/15/23   [provider]  metoprolol tartrate (LOPRESSOR) 50 MG tablet Take 50 mg by mouth 2 (two) times daily. Patient not taking: Reported on 10/13/2023 07/05/23   [provider]     Physical Exam: Vitals:   10/13/23 1915 10/13/23 1930 10/13/23 1945 10/13/23 2000  BP: (!) 147/68 (!) 157/66 134/67 (!) 158/73  Pulse:      Resp:      Temp:      TempSrc:      SpO2:      Weight:      Height:        Physical Exam Constitutional:      General: She is not in acute distress.    Appearance: She is not ill-appearing.  HENT:     Head: Normocephalic and atraumatic.     Mouth/Throat:     Mouth: Mucous membranes are dry.  Eyes:     Conjunctiva/sclera: Conjunctivae normal.  Cardiovascular:     Rate and Rhythm: Normal rate and regular rhythm.     Pulses: Normal pulses.     Heart sounds: Normal heart  sounds.  Pulmonary:     Effort: Pulmonary effort is normal.     Breath sounds: Normal breath sounds.  Abdominal:     General: Bowel sounds are normal.  Musculoskeletal:        General: Tenderness present. No swelling, deformity or signs of injury.     Cervical back: Neck supple.     Right lower leg: No edema.     Left lower leg: No edema.     Comments: Generalized tenderness to touch to upper lower extremities   Neurological:     Mental Status: She is oriented to person, place, and time.     Motor: No weakness.  Psychiatric:        Mood and Affect: Mood normal.        Thought Content: Thought content normal.      Labs on Admission: I have personally reviewed following labs and imaging studies  CBC: Recent Labs  Lab 10/13/23 1147 10/13/23 1552  WBC 6.0 6.8  NEUTROABS 3.9 4.4  HGB 9.6* 8.9*  HCT 30.7* 29.3*  MCV 83.2 84.9  PLT 304 277   Basic Metabolic Panel: Recent Labs  Lab 10/13/23 1147 10/13/23 1552  NA 138 140  K 4.0 3.9  CL 108 112*  CO2 19* 19*  GLUCOSE 152* 152*  BUN 47* 49*  CREATININE 3.04* 3.03*  CALCIUM 9.9 9.5   GFR: Estimated Creatinine Clearance: 17.4 mL/min (A) (by C-G formula based on SCr of 3.03 mg/dL (H)). Liver Function Tests: Recent Labs  Lab 10/13/23 1147  AST 371*  ALT 237*  ALKPHOS 64  BILITOT 0.6  PROT 8.3*  ALBUMIN 3.1*   No results for input(s): "LIPASE", "AMYLASE" in the last 168 hours. No results for input(s): "AMMONIA" in the last 168 hours. Coagulation Profile: No results for input(s): "INR", "PROTIME" in the last 168 hours. Cardiac Enzymes: Recent  Labs  Lab 10/13/23 1552  CKTOTAL 8,213*   BNP (last 3 results) Recent Labs    04/05/23 0040  BNP 690.8*   HbA1C: No results for input(s): "HGBA1C" in the last 72 hours. CBG: No results for input(s): "GLUCAP" in the last 168 hours. Lipid Profile: No results for input(s): "CHOL", "HDL", "LDLCALC", "TRIG", "CHOLHDL", "LDLDIRECT" in the last 72 hours. Thyroid  Function Tests: No results for input(s): "TSH", "T4TOTAL", "FREET4", "T3FREE", "THYROIDAB" in the last 72 hours. Anemia Panel: No results for input(s): "VITAMINB12", "FOLATE", "FERRITIN", "TIBC", "IRON", "RETICCTPCT" in the last 72 hours. Urine analysis:    Component Value Date/Time   COLORURINE YELLOW 10/13/2023 1916   APPEARANCEUR HAZY (A) 10/13/2023 1916   LABSPEC 1.014 10/13/2023 1916   PHURINE 5.0 10/13/2023 1916   GLUCOSEU NEGATIVE 10/13/2023 1916   HGBUR LARGE (A) 10/13/2023 1916   BILIRUBINUR NEGATIVE 10/13/2023 1916   KETONESUR NEGATIVE 10/13/2023 1916   PROTEINUR 100 (A) 10/13/2023 1916   NITRITE NEGATIVE 10/13/2023 1916   LEUKOCYTESUR NEGATIVE 10/13/2023 1916    Radiological Exams on Admission: I have personally reviewed images No results found.   EKG: Pending EKG  Assessment/Plan: Principal Problem:   Acute kidney injury superimposed on CKD [facility]) Active Problems:   Transaminitis   Rhabdomyolysis   Myelopathy (HCC)   History of CAD (coronary artery disease) s/p CABG 06/2023   Chronic systolic CHF (congestive heart failure) (HCC) reduced EF 25 to 30%   Essential hypertension   Orthostatic hypotension   Peripheral neuropathy   Non-insulin dependent type 2 diabetes mellitus (HCC)   Hyperlipidemia   Anemia of chronic disease    Assessment and Plan: Intrarenal acute kidney injury superimposed on CKD stage 3a -Acute kidney injury secondary to in the context of rhabdomyolysis.  Heme pigment associated intrarenal acute kidney injury. -Creatinine 3.04 on presentation.  Patient's baseline creatinine around 1.7 to 2) and GFR 90 for 37-43 - Found to have elevated CK around 8100 in the setting of Crestor use. - UA hazy appearance, dipstick hemoglobin positive but RBC within normal range which is usual clinical picture of rhabdomyolysis.  Checking urine sodium and urine creatinine. -Pending renal ultrasound.  Even though patient has history of CKD stage IIIa at the His  line unable to find any renal ultrasound on the chart. -In the ED patient has been resuscitated with 1 L of NS bolus.  Given patient has history of CHFrEF 25 to 30% continue gentle hydration with LR 50 cc/h. -Continue to monitor renal function, avoid nephrotoxic agent, avoid hypotension and renally adjust medications.   Transaminitis -Elevated AST 371, elevated ALT 237, normal ALP and bilirubin 11.  Transaminitis in the context of rhabdomyolysis. - Holding Crestor and Tylenol. - Continue to monitor hepatic function panel for improvement of transaminitis.  Peripheral myelopathy Rhabdomyolysis -Patient is reporting bilateral upper and lower extremities on discharge muscular pain with weakness for last 7 days which has been progressively getting worse.  Bilateral lower extremity muscle pain/weakness is more as compared to bilateral upper extremities. -Elevated CK around 8000.  Rhabdomyolysis in the context of Crestor use since 06/29/2023.  Patient did any fall injury.  Currently patient has been resuscitated with 1 L of NS bolus. - Continue maintenance fluid LR 50 cc/h, continue monitor CK level.  History of CAD status post CABG 06/2023 -Patient underwent CABG on 06/29/2023 at [facility]. -Continue aspirin 81 mg daily, Brilinta 90 mg twice daily and metoprolol 25 mg twice daily.  Holding Crestor in the setting of transaminitis  and rhabdomyolysis. -Patient reported she already took Brilinta tonight 1 hour ago from her home medication that she has been brought with her in the room.  Next dose is tomorrow morning.  History of CHF with reduced EF 25 to 30% Essential hypertension History of Orthostatic hypotension -At the baseline patient has history of chronic orthostatic hypotension takes midodrine as needed at home.  Currently blood pressure within good range.  Continue Lopressor 25 mg twice daily.  Holding midodrine for now. -Goal for systolic blood pressure 1 40-1 50-3 blood pressure 80-90 range  as at the baseline patient has orthostatic hypotension and postural dizziness.   Peripheral neuropathy -Chronic peripheral neuropathy in the setting of chronic DM type II - Patient is not on gabapentin at home.  Unsure if patient take tramadol or not.  Need pharmacy verification before restarting tramadol.  Non-insulin-dependent DM type II -Holding glipizide in the context of AKI and in hospital setting - A1c 6.5 in June 2024 - Continue low sliding scale SSI as needed POC blood glucose check.  Hyperlipidemia -Holding Crestor in the setting of rhabdomyolysis and transaminitis.  Anemia of chronic disease -Hemoglobin 8.9, hematocrit 29 and MCV 84.  Baseline hemoglobin around 9.6-11.3.  Concern for chronic anemia in the setting of CKD stage IIIb. - Checking anemia panel and FOBT.   DVT prophylaxis:  SQ Heparin Code Status:  Full Code Diet: Heart healthy carb modified diet Family Communication:   Family was present at bedside, at the time of interview.  Opportunity was given to ask question and all questions were answered satisfactorily.  Disposition Plan: Pending improvement of renal function, hepatic function and CK level.  Tentative discharge to home next 2 to 3 days. Consults:  none required at this time. Admission status:   Inpatient, cardiac-Telemetry bed  Severity of Illness: The appropriate patient status for this patient is INPATIENT. Inpatient status is judged to be reasonable and necessary in order to provide the required intensity of service to ensure the patient's safety. The patient's presenting symptoms, physical exam findings, and initial radiographic and laboratory data in the context of their chronic comorbidities is felt to place them at high risk for further clinical deterioration. Furthermore, it is not anticipated that the patient will be medically stable for discharge from the hospital within 2 midnights of admission.   * I certify that at the point of admission it  is my clinical judgment that the patient will require inpatient hospital care spanning beyond 2 midnights from the point of admission due to high intensity of service, high risk for further deterioration and high frequency of surveillance required.Marland Kitchen    Tereasa Coop, MD [facility]  How to contact the [facility] Attending or Consulting provider 7A - 7P or covering provider during after hours 7P -7A, for this patient.  Check the care team in [facility] and look for a) attending/consulting [facility] provider listed and b) the [facility] team listed Log into [email or web address] and use [place]'s universal password to access. If you do not have the password, please contact the hospital operator. Locate the [facility] provider you are looking for under [facility] and page to a number that you can be directly reached. If you still have difficulty reaching the provider, please page the [facility] (Director on Call) for the Hospitalists listed on amion for assistance.  10/13/2023, 8:56 PM

## 2023-10-13 NOTE — ED Provider Notes (Signed)
[place] EMERGENCY DEPARTMENT AT [facility] Provider Note   CSN: [id number] Arrival date & time: 10/13/23  1516     History  Chief Complaint  Patient presents with   Abnormal Lab    Patricia Mooney is a 67 y.o. female.   Abnormal Lab    Pt states she went to the urgent care today because she was having leg cramping.  She can walk but it is hard for her.  This has been ongoing for about a week.  She has been doing cardiac rehab and thought it was related to her exercises.  At the urgent care she had labs and they were abnormal.  She was told to come to the hospital.,  Home Medications Prior to Admission medications   Medication Sig Start Date End Date Taking? Authorizing Provider  acetaminophen (TYLENOL) 500 MG tablet Take 1,000 mg by mouth every 8 (eight) hours as needed for moderate pain (pain score 4-6).   Yes [provider]  ASPIRIN LOW DOSE 81 MG tablet Take 1 tablet (81 mg total) by mouth daily. 04/21/23  Yes Patwardhan, Manish J, MD  cholecalciferol (VITAMIN D) 1000 units tablet Take 1,000 Units by mouth daily.   Yes [provider]  diclofenac Sodium (VOLTAREN) 1 % GEL Apply 1 g topically 3 (three) times daily. 03/31/23  Yes [provider]  famotidine (PEPCID) 10 MG tablet Take 10 mg by mouth daily.   Yes [provider]  glipiZIDE (GLUCOTROL XL) 5 MG 24 hr tablet Take 5 mg by mouth at bedtime.   Yes [provider]  metoprolol succinate (TOPROL-XL) 25 MG 24 hr tablet Take 0.5 tablets (12.5 mg total) by mouth every morning. Take with or immediately following a meal. 09/15/23 09/04/25 Yes Patwardhan, Manish J, MD  metoprolol tartrate (LOPRESSOR) 25 MG tablet Take 25 mg by mouth 2 (two) times daily.   Yes [provider]  midodrine (PROAMATINE) 5 MG tablet Take 1 tablet (5 mg total) by mouth 3 (three) times daily while awake with food as needed for dizziness 04/10/23  Yes Yates Decamp, MD  nitroGLYCERIN (NITROSTAT)  0.4 MG SL tablet Place 1 tablet (0.4 mg total) under the tongue every 5 (five) minutes as needed for chest pain. 04/21/23 10/13/23 Yes Patwardhan, Manish J, MD  rosuvastatin (CRESTOR) 40 MG tablet Take 1 tablet by mouth daily. 07/05/23 07/04/24 Yes [provider]  ticagrelor (BRILINTA) 90 MG TABS tablet Take 1 tablet (90 mg total) by mouth 2 (two) times daily. 04/21/23  Yes Patwardhan, Anabel Bene, MD  traMADol (ULTRAM) 50 MG tablet Take 50 mg by mouth every 6 (six) hours as needed for moderate pain (pain score 4-6). 07/30/23  Yes [provider]  vitamin B-12 (CYANOCOBALAMIN) 100 MCG tablet Take 100 mcg by mouth daily.   Yes [provider]  amoxicillin-clavulanate (AUGMENTIN) 875-125 MG tablet Take 1 tablet by mouth 2 (two) times daily. Patient not taking: Reported on 10/13/2023 09/15/23   [provider]  metoprolol tartrate (LOPRESSOR) 50 MG tablet Take 50 mg by mouth 2 (two) times daily. Patient not taking: Reported on 10/13/2023 07/05/23   [provider]      Allergies    Ramipril and Codeine    Review of Systems   Review of Systems  Physical Exam Updated Vital Signs BP (!) 158/73   Pulse 82   Temp 97.8 F (36.6 C)   Resp 16   Ht 1.715 m (5' 7.5")   Wt 67.4  kg   SpO2 100%   BMI 22.93 kg/m  Physical Exam Vitals and nursing note reviewed.  Constitutional:      General: She is not in acute distress.    Appearance: She is well-developed.  HENT:     Head: Normocephalic and atraumatic.     Right Ear: External ear normal.     Left Ear: External ear normal.  Eyes:     General: No scleral icterus.       Right eye: No discharge.        Left eye: No discharge.     Conjunctiva/sclera: Conjunctivae normal.  Neck:     Trachea: No tracheal deviation.  Cardiovascular:     Rate and Rhythm: Normal rate and regular rhythm.  Pulmonary:     Effort: Pulmonary effort is normal. No respiratory distress.     Breath sounds: Normal breath sounds. No  stridor. No wheezing or rales.  Abdominal:     General: Bowel sounds are normal. There is no distension.     Palpations: Abdomen is soft.     Tenderness: There is no abdominal tenderness. There is no guarding or rebound.  Musculoskeletal:        General: No tenderness or deformity.     Cervical back: Neck supple.  Skin:    General: Skin is warm and dry.     Findings: No rash.  Neurological:     General: No focal deficit present.     Mental Status: She is alert.     Cranial Nerves: No cranial nerve deficit, dysarthria or facial asymmetry.     Sensory: No sensory deficit.     Motor: No abnormal muscle tone or seizure activity.     Coordination: Coordination normal.  Psychiatric:        Mood and Affect: Mood normal.     ED Results / Procedures / Treatments   Labs (all labs ordered are listed, but only abnormal results are displayed) Labs Reviewed  BASIC METABOLIC PANEL - Abnormal; Notable for the following components:      Result Value   Chloride 112 (*)    CO2 19 (*)    Glucose, Bld 152 (*)    BUN 49 (*)    Creatinine, Ser 3.03 (*)    GFR, Estimated 16 (*)    All other components within normal limits  CBC WITH DIFFERENTIAL/PLATELET - Abnormal; Notable for the following components:   RBC 3.45 (*)    Hemoglobin 8.9 (*)    HCT 29.3 (*)    MCH 25.8 (*)    RDW 16.4 (*)    All other components within normal limits  URINALYSIS, ROUTINE W REFLEX MICROSCOPIC - Abnormal; Notable for the following components:   APPearance HAZY (*)    Hgb urine dipstick LARGE (*)    Protein, ur 100 (*)    Bacteria, UA RARE (*)    All other components within normal limits  CK - Abnormal; Notable for the following components:   Total CK 8,213 (*)    All other components within normal limits    EKG None  Radiology No results found.  Procedures Procedures    Medications Ordered in ED Medications  sodium chloride 0.9 % bolus 500 mL (500 mLs Intravenous Bolus 10/13/23 1845)  sodium  chloride 0.9 % bolus 1,000 mL (1,000 mLs Intravenous New Bag/Given 10/13/23 2011)    ED Course/ Medical Decision Making/ A&P Clinical Course as of 10/13/23 2024  Tue Oct 13, 2023  1957 Urinalysis  not suggestive of infection.  Metabolic panel shows increased creatinine. [JK]  2001 Bladder scan without urinary retention [JK]  2023 Case discussed with Dr. Janalyn Shy regarding admission [JK]    Clinical Course User Index [JK] Linwood Dibbles, MD                                 Medical Decision Making Problems Addressed: AKI (acute kidney injury) [facility]): acute illness or injury that poses a threat to life or bodily functions Non-traumatic rhabdomyolysis: acute illness or injury that poses a threat to life or bodily functions  Amount and/or Complexity of Data Reviewed Labs: ordered. Decision-making details documented in ED Course.   Patient presented to the ED for evaluation of abnormal renal function.  Patient's laboratory tests are notable for an elevated BUN and creatinine.  She does not have urinary retention.  She also has been complaining of muscle aches.  CK does show significant elevation of 8213.  Patient denies any trauma. Suspect this could be related to her cholesterol medication, crestor.  I have started on IV fluids.  I will consult the medical service for admission and further treatment        Final Clinical Impression(s) / ED Diagnoses Final diagnoses:  Non-traumatic rhabdomyolysis  AKI (acute kidney injury) [facility])    Rx / DC Orders ED Discharge Orders     None         Linwood Dibbles, MD 10/13/23 2025

## 2023-10-13 NOTE — ED Triage Notes (Signed)
Pt sent by UC for elevated liver labs and elevated kidney labs. Pt c/o pain in legs bilatx1wk.

## 2023-10-13 NOTE — ED Notes (Signed)
ED TO INPATIENT HANDOFF REPORT  ED Nurse Name and Phone #:   Molli Hazard [phone]  S Name/Age/Gender Patricia Mooney 67 y.o. female Room/Bed: 022C/022C  Code Status   Code Status: Full Code  Home/SNF/Other Home Patient oriented to: self, place, time, and situation Is this baseline? Yes   Triage Complete: Triage complete  Chief Complaint AKI (acute kidney injury) [facility]) [N17.9]  Triage Note Pt sent by UC for elevated liver labs and elevated kidney labs. Pt c/o pain in legs bilatx1wk.   Allergies Allergies  Allergen Reactions   Ramipril Swelling    Facial swelling after 1 pill.   Codeine Nausea Only    Level of Care/Admitting Diagnosis ED Disposition     ED Disposition  Admit   Condition  --   Comment  Hospital Area: [facility] [[id number]]  Level of Care: Telemetry Cardiac [[id number]]  May admit patient to [facility] or [facility] if equivalent level of care is available:: No  Covid Evaluation: Asymptomatic - no recent exposure (last 10 days) testing not required  Diagnosis: AKI (acute kidney injury) [facility]) [[id number]]  Admitting Physician: Tereasa Coop [[id number]]  Attending Physician: Tereasa Coop [[id number]]  Certification:: I certify this patient will need inpatient services for at least 2 midnights  Expected Medical Readiness: 10/17/2023          B Medical/Surgery History Past Medical History:  Diagnosis Date   Hypercholesteremia    Hypertension    Periorbital edema of right eye admitted 10/07/2016   Type II diabetes mellitus (HCC)    Past Surgical History:  Procedure Laterality Date   ABDOMINAL HYSTERECTOMY  1980   CESAREAN SECTION  1975; 1978   CORONARY STENT INTERVENTION N/A 04/09/2023   Procedure: CORONARY STENT INTERVENTION;  Surgeon: Elder Negus, MD;  Location: [facility] INVASIVE CV LAB;  Service: Cardiovascular;  Laterality: N/A;   CORONARY ULTRASOUND/IVUS N/A 04/09/2023   Procedure: Coronary Ultrasound/IVUS;  Surgeon: Elder Negus, MD;  Location: [facility] INVASIVE CV LAB;  Service: Cardiovascular;  Laterality: N/A;   NEPHRECTOMY Right 1980   "said I was born w/one that didn't work"   RIGHT/LEFT HEART CATH AND CORONARY ANGIOGRAPHY N/A 04/04/2023   Procedure: RIGHT/LEFT HEART CATH AND CORONARY ANGIOGRAPHY;  Surgeon: Elder Negus, MD;  Location: [facility] INVASIVE CV LAB;  Service: Cardiovascular;  Laterality: N/A;     A IV Location/Drains/Wounds Patient Lines/Drains/Airways Status     Active Line/Drains/Airways     Name Placement date Placement time Site Days   Peripheral IV 10/13/23 20 G Left Antecubital 10/13/23  1835  Antecubital  less than 1            Intake/Output Last 24 hours No intake or output data in the 24 hours ending 10/13/23 2113  Labs/Imaging Results for orders placed or performed during the hospital encounter of 10/13/23 (from the past 48 hours)  Basic metabolic panel     Status: Abnormal   Collection Time: 10/13/23  3:52 PM  Result Value Ref Range   Sodium 140 135 - 145 mmol/L   Potassium 3.9 3.5 - 5.1 mmol/L   Chloride 112 (H) 98 - 111 mmol/L   CO2 19 (L) 22 - 32 mmol/L   Glucose, Bld 152 (H) 70 - 99 mg/dL    Comment: Glucose reference range applies only to samples taken after fasting for at least 8 hours.   BUN 49 (H) 8 - 23 mg/dL   Creatinine, Ser 2.13 (H) 0.44 - 1.00 mg/dL   Calcium  9.5 8.9 - 10.3 mg/dL   GFR, Estimated 16 (L) >60 mL/min    Comment: (NOTE) Calculated using the CKD-EPI Creatinine Equation (2021)    Anion gap 9 5 - 15    Comment: Performed at [facility], [address], [place], [place] [address]  CBC with Differential     Status: Abnormal   Collection Time: 10/13/23  3:52 PM  Result Value Ref Range   WBC 6.8 4.0 - 10.5 K/uL   RBC 3.45 (L) 3.87 - 5.11 MIL/uL   Hemoglobin 8.9 (L) 12.0 - 15.0 g/dL   HCT 60.4 (L) 54.0 - 98.1 %   MCV 84.9 80.0 - 100.0 fL   MCH 25.8 (L) 26.0 - 34.0 pg   MCHC 30.4 30.0 - 36.0 g/dL   RDW 19.1 (H) 47.8 - 29.5 %    Platelets 277 150 - 400 K/uL   nRBC 0.0 0.0 - 0.2 %   Neutrophils Relative % 65 %   Neutro Abs 4.4 1.7 - 7.7 K/uL   Lymphocytes Relative 23 %   Lymphs Abs 1.5 0.7 - 4.0 K/uL   Monocytes Relative 9 %   Monocytes Absolute 0.6 0.1 - 1.0 K/uL   Eosinophils Relative 2 %   Eosinophils Absolute 0.1 0.0 - 0.5 K/uL   Basophils Relative 0 %   Basophils Absolute 0.0 0.0 - 0.1 K/uL   Immature Granulocytes 1 %   Abs Immature Granulocytes 0.05 0.00 - 0.07 K/uL    Comment: Performed at [facility], [address] [place], [place] [address]  CK     Status: Abnormal   Collection Time: 10/13/23  3:52 PM  Result Value Ref Range   Total CK 8,213 (H) 38 - 234 U/L    Comment: RESULT CONFIRMED BY MANUAL DILUTION Performed at [facility], [address], [place], [place] [address]   Urinalysis, Routine w reflex microscopic -Urine, Clean Catch     Status: Abnormal   Collection Time: 10/13/23  7:16 PM  Result Value Ref Range   Color, Urine YELLOW YELLOW   APPearance HAZY (A) CLEAR   Specific Gravity, Urine 1.014 1.005 - 1.030   pH 5.0 5.0 - 8.0   Glucose, UA NEGATIVE NEGATIVE mg/dL   Hgb urine dipstick LARGE (A) NEGATIVE   Bilirubin Urine NEGATIVE NEGATIVE   Ketones, ur NEGATIVE NEGATIVE mg/dL   Protein, ur 469 (A) NEGATIVE mg/dL   Nitrite NEGATIVE NEGATIVE   Leukocytes,Ua NEGATIVE NEGATIVE   RBC / HPF 0-5 0 - 5 RBC/hpf   WBC, UA 0-5 0 - 5 WBC/hpf   Bacteria, UA RARE (A) NONE SEEN   Squamous Epithelial / HPF 0-5 0 - 5 /HPF   Mucus PRESENT     Comment: Performed at [facility], [address] [place], [place] [address]   No results found.  Pending Labs Unresulted Labs (From admission, onward)     Start     Ordered   10/14/23 0500  CK  Daily,   R      10/13/23 2033   10/14/23 0500  Comprehensive metabolic panel  Daily,   R      10/13/23 2033   10/14/23 0500  CBC  Daily,   R      10/13/23 2033   10/13/23 2107  HIV Antibody (routine testing w rflx)  (HIV Antibody  (Routine testing w reflex) panel)  Add-on,   AD        10/13/23 2106  10/13/23 2045  Vitamin B12  (Anemia Panel (PNL))  Add-on,   AD        10/13/23 2044   10/13/23 2045  Folate  (Anemia Panel (PNL))  Add-on,   AD        10/13/23 2044   10/13/23 2045  Iron and TIBC  (Anemia Panel (PNL))  Add-on,   AD        10/13/23 2044   10/13/23 2045  Ferritin  (Anemia Panel (PNL))  Add-on,   AD        10/13/23 2044   10/13/23 2045  Reticulocytes  (Anemia Panel (PNL))  Add-on,   AD        10/13/23 2044   10/13/23 2044  Occult blood card to lab, stool  Once,   R        10/13/23 2044   10/13/23 2043  Hemoglobin A1c  Once,   R       Comments: To assess prior glycemic control    10/13/23 2042   10/13/23 2033  Hepatitis panel, acute  Add-on,   AD        10/13/23 2033   10/13/23 2032  Sodium, urine, random  Once,   R        10/13/23 2033   10/13/23 2032  Creatinine, urine, random  Once,   R        10/13/23 2033            Vitals/Pain Today's Vitals   10/13/23 1915 10/13/23 1930 10/13/23 1945 10/13/23 2000  BP: (!) 147/68 (!) 157/66 134/67 (!) 158/73  Pulse:      Resp:      Temp:      TempSrc:      SpO2:      Weight:      Height:      PainSc:        Isolation Precautions No active isolations  Medications Medications  lactated ringers infusion (has no administration in time range)  aspirin EC tablet 81 mg (has no administration in time range)  metoprolol tartrate (LOPRESSOR) tablet 25 mg (has no administration in time range)  heparin injection 5,000 Units (has no administration in time range)  sodium chloride flush (NS) 0.9 % injection 3 mL (has no administration in time range)  sodium chloride flush (NS) 0.9 % injection 3 mL (has no administration in time range)  sodium chloride flush (NS) 0.9 % injection 3 mL (has no administration in time range)  0.9 %  sodium chloride infusion (250 mLs Intravenous New Bag/Given 10/13/23 2057)  senna-docusate (Senokot-S) tablet 1 tablet (has  no administration in time range)  ondansetron (ZOFRAN) tablet 4 mg (has no administration in time range)    Or  ondansetron (ZOFRAN) injection 4 mg (has no administration in time range)  insulin aspart (novoLOG) injection 0-6 Units (has no administration in time range)  insulin aspart (novoLOG) injection 0-5 Units (has no administration in time range)  ticagrelor (BRILINTA) tablet 90 mg (has no administration in time range)  sodium chloride 0.9 % bolus 500 mL (500 mLs Intravenous Bolus 10/13/23 1845)  sodium chloride 0.9 % bolus 1,000 mL (0 mLs Intravenous Stopped 10/13/23 2029)    Mobility walks     Focused Assessments Cardiac Assessment Handoff:    Lab Results  Component Value Date   CKTOTAL 8,213 (H) 10/13/2023   Lab Results  Component Value Date   DDIMER 1.53 (H) 10/13/2023   Does the Patient currently have chest pain?   ,  Neuro Assessment Handoff:  Swallow screen pass? Yes          Neuro Assessment:   Neuro Checks:      Has TPA been given?  If patient is a Neuro Trauma and patient is going to OR before floor call report to 4N Charge nurse: [phone] or [phone]  , Renal Assessment Handoff:  Hemodialysis Schedule:  Last Hemodialysis date and time:    Restricted appendage:   , Pulmonary Assessment Handoff:  Lung sounds:          R Recommendations: See Admitting Provider Note  Report given to:   Additional Notes:   Pt is kind and pleasant and coopertative. Pt reports weakness/heaviness from waist down. Pt reports she recently perfromed a stressed test with her Cardiologist and reports since then has been feeling weak. Labs are vitals are as noted.

## 2023-10-14 DIAGNOSIS — N189 Chronic kidney disease, unspecified: Secondary | ICD-10-CM | POA: Diagnosis not present

## 2023-10-14 DIAGNOSIS — N179 Acute kidney failure, unspecified: Secondary | ICD-10-CM | POA: Diagnosis not present

## 2023-10-14 LAB — CBC
HCT: 26.3 % — ABNORMAL LOW (ref 36.0–46.0)
Hemoglobin: 8.2 g/dL — ABNORMAL LOW (ref 12.0–15.0)
MCH: 26.1 pg (ref 26.0–34.0)
MCHC: 31.2 g/dL (ref 30.0–36.0)
MCV: 83.8 fL (ref 80.0–100.0)
Platelets: 242 10*3/uL (ref 150–400)
RBC: 3.14 MIL/uL — ABNORMAL LOW (ref 3.87–5.11)
RDW: 16.3 % — ABNORMAL HIGH (ref 11.5–15.5)
WBC: 6.7 10*3/uL (ref 4.0–10.5)
nRBC: 0 % (ref 0.0–0.2)

## 2023-10-14 LAB — COMPREHENSIVE METABOLIC PANEL
ALT: 201 U/L — ABNORMAL HIGH (ref 0–44)
AST: 278 U/L — ABNORMAL HIGH (ref 15–41)
Albumin: 2.5 g/dL — ABNORMAL LOW (ref 3.5–5.0)
Alkaline Phosphatase: 55 U/L (ref 38–126)
Anion gap: 9 (ref 5–15)
BUN: 46 mg/dL — ABNORMAL HIGH (ref 8–23)
CO2: 17 mmol/L — ABNORMAL LOW (ref 22–32)
Calcium: 9.2 mg/dL (ref 8.9–10.3)
Chloride: 111 mmol/L (ref 98–111)
Creatinine, Ser: 2.86 mg/dL — ABNORMAL HIGH (ref 0.44–1.00)
GFR, Estimated: 17 mL/min — ABNORMAL LOW (ref >60)
Glucose, Bld: 167 mg/dL — ABNORMAL HIGH (ref 70–99)
Potassium: 3.7 mmol/L (ref 3.5–5.1)
Sodium: 137 mmol/L (ref 135–145)
Total Bilirubin: 0.5 mg/dL (ref <1.2)
Total Protein: 6.7 g/dL (ref 6.5–8.1)

## 2023-10-14 LAB — CK
Total CK: 6573 U/L — ABNORMAL HIGH (ref 38–234)
Total CK: 6009 U/L — ABNORMAL HIGH (ref 38–234)

## 2023-10-14 LAB — GLUCOSE, CAPILLARY: Glucose-Capillary: 94 mg/dL (ref 70–99)

## 2023-10-14 NOTE — TOC Transition Note (Signed)
Transition of Care [facility]) - Discharge Note   Patient Details  Name: Patricia Mooney MRN: [id number] Date of Birth: 01-Sep-1956  Transition of Care [facility]) CM/SW Contact:  Leone Haven, RN Phone Number: 10/14/2023, 10:13 AM   Clinical Narrative:     Dc home today, no needs.        Patient Goals and CMS Choice            Discharge Placement                       Discharge Plan and Services Additional resources added to the After Visit Summary for                                       Social Drivers of Health (SDOH) Interventions SDOH Screenings   Food Insecurity: Low Risk  (07/01/2023)   Received from [facility]  Housing: Low Risk  (07/01/2023)   Received from [facility]  Transportation Needs: No Transportation Needs (07/01/2023)   Received from [facility]  Utilities: Low Risk  (07/01/2023)   Received from [facility]  Tobacco Use: Low Risk  (10/13/2023)  Recent Concern: Tobacco Use - Medium Risk (09/15/2023)   Received from [facility]     Readmission Risk Interventions     No data to display

## 2023-10-14 NOTE — Care Management Obs Status (Signed)
MEDICARE OBSERVATION STATUS NOTIFICATION   Patient Details  Name: Patricia Mooney MRN: [id number] Date of Birth: [birth year]   Medicare Observation Status Notification Given:  Yes    Leone Haven, RN 10/14/2023, 10:08 AM

## 2023-10-14 NOTE — Care Management CC44 (Signed)
Condition Code 44 Documentation Completed  Patient Details  Name: Patricia Mooney MRN: [id number] Date of Birth: 21-Dec-1955   Condition Code 44 given:  Yes Patient signature on Condition Code 44 notice:  Yes Documentation of 2 MD's agreement:  Yes Code 44 added to claim:  Yes    Leone Haven, RN 10/14/2023, 10:08 AM

## 2023-10-14 NOTE — Discharge Summary (Signed)
Physician Discharge Summary  Patricia Mooney:[id number] DOB: 10-13-1956 DOA: 10/13/2023  PCP: Lavonda Jumbo, PA-C  Admit date: 10/13/2023 Discharge date: 10/14/2023  Admitted From: Home Disposition:  Home  Recommendations for Outpatient Follow-up:  Follow up with PCP in 1-2 weeks  Home Health:None  Equipment/Devices:None  Discharge Condition:Stable  CODE STATUS:Full  Diet recommendation:  Low salt low fat  Brief/Interim Summary: Patricia Mooney is a 67 y.o. female with medical history significant of ischemic cardiomyopathy, CAD status post CABG 06/29/2023 currently doing cardiac rehab,, chronic systolic heart failure EF 25 to 30%, hypertension, hypercholesteremia, CKD stage IIIb, chronic orthostatic hypotension due to diabetic peripheral neuropathy and non-insulin-dependent DM type II. She has been presented to emergency department referred from urgent care today as she was having leg cramping.  Patient stated that she is able to walk but it is hard for her to walk.   Patient admitted as above with mild AKI with baseline CKD stage IIIb with concurrent elevated CK.  Patient appears to have been "overdoing it" recently while at cardiac rehab and while given encouragement of her marked improvement in activity level we did discuss moderation.  After IV fluids overnight patient now tolerating p.o. quite well, notes cramping/heaviness in her legs has essentially resolved and is otherwise requesting discharge home which is certainly reasonable.  We discussed close follow-up with PCP in the next 1 to 2 weeks for repeat labs to ensure resolution of symptoms and to ensure no further recurrence.  We did recommend at least 24 to 48 hours of limited activity, she is due to return to cardiac rehab tomorrow on the 26th which we discussed should not be attended.  Patient understands the risks of continued aggressive/intensive physical therapy and activity in the setting of her current situation.  She is  otherwise stable and agreeable for discharge home.   Discharge Diagnoses:  Principal Problem:   Acute kidney injury superimposed on CKD [facility]) Active Problems:   Transaminitis   Rhabdomyolysis   Myelopathy (HCC)   History of CAD (coronary artery disease) s/p CABG 06/2023   Chronic systolic CHF (congestive heart failure) (HCC) reduced EF 25 to 30%   Essential hypertension   Orthostatic hypotension   Peripheral neuropathy   Non-insulin dependent type 2 diabetes mellitus (HCC)   Hyperlipidemia   Anemia of chronic disease  Acute kidney injury.  Likely multifactorial - Renal function returning to baseline -Likely related to volume depletion, increased activity, elevated CK/statin use as well as reduced EF   Transaminitis -In the setting of statin use and mild CK elevation -Downtrending with p.o. intake, continue to hold statin until follow-up with PCP   History of CAD status post CABG 06/2023 -Recent CABG on 06/29/2023 at [facility]. -DC statin otherwise no medication changes -Discussed skipping at minimum 124 to 48 hours of strenuous activity including cardiac rehab   History of CHF with reduced EF 25 to 30% Essential hypertension History of Orthostatic hypotension -No medication changes, appears euvolemic, increase p.o. intake as appropriate. -Discussed need to monitor volume status and daily weights while increased free water intake   Peripheral neuropathy -Resume home medications, no changes at this time   Non-insulin-dependent DM type II - A1c earlier this year 6.9, well-controlled - continue home regimen  Hyperlipidemia DC CRESTOR   Anemia of chronic disease -At baseline, no signs or symptoms of bleeding, iron panel/B12 within normal limits  Discharge Instructions   Allergies as of 10/14/2023       Reactions  Ramipril Swelling   Facial swelling after 1 pill.   Codeine Nausea Only        Medication List     STOP taking these medications     amoxicillin-clavulanate 875-125 MG tablet Commonly known as: AUGMENTIN   rosuvastatin 40 MG tablet Commonly known as: CRESTOR       TAKE these medications    acetaminophen 500 MG tablet Commonly known as: TYLENOL Take 1,000 mg by mouth every 8 (eight) hours as needed for moderate pain (pain score 4-6).   Aspirin Low Dose 81 MG tablet Generic drug: aspirin EC Take 1 tablet (81 mg total) by mouth daily.   cholecalciferol 1000 units tablet Commonly known as: VITAMIN D Take 1,000 Units by mouth daily.   diclofenac Sodium 1 % Gel Commonly known as: VOLTAREN Apply 1 g topically 3 (three) times daily.   famotidine 10 MG tablet Commonly known as: PEPCID Take 10 mg by mouth daily.   glipiZIDE 5 MG 24 hr tablet Commonly known as: GLUCOTROL XL Take 5 mg by mouth at bedtime.   metoprolol succinate 25 MG 24 hr tablet Commonly known as: TOPROL-XL Take 0.5 tablets (12.5 mg total) by mouth every morning. Take with or immediately following a meal.   metoprolol tartrate 25 MG tablet Commonly known as: LOPRESSOR Take 25 mg by mouth 2 (two) times daily. What changed: Another medication with the same name was removed. Continue taking this medication, and follow the directions you see here.   midodrine 5 MG tablet Commonly known as: PROAMATINE Take 1 tablet (5 mg total) by mouth 3 (three) times daily while awake with food as needed for dizziness   nitroGLYCERIN 0.4 MG SL tablet Commonly known as: NITROSTAT Place 1 tablet (0.4 mg total) under the tongue every 5 (five) minutes as needed for chest pain.   ticagrelor 90 MG Tabs tablet Commonly known as: BRILINTA Take 1 tablet (90 mg total) by mouth 2 (two) times daily.   traMADol 50 MG tablet Commonly known as: ULTRAM Take 50 mg by mouth every 6 (six) hours as needed for moderate pain (pain score 4-6).   vitamin B-12 100 MCG tablet Commonly known as: CYANOCOBALAMIN Take 100 mcg by mouth daily.        Allergies  Allergen  Reactions   Ramipril Swelling    Facial swelling after 1 pill.   Codeine Nausea Only    Consultations: None  Procedures/Studies: US RENAL Result Date: 10/13/2023 CLINICAL DATA:  History of prior right nephrectomy, presenting with acute kidney injury. EXAM: RENAL / URINARY TRACT ULTRASOUND COMPLETE COMPARISON:  July 29, 2023 FINDINGS: Right Kidney: The right kidney is surgically absent. Left Kidney: Renal measurements: 11.0 cm x 6.6 cm x 5.0 cm = volume: 189.53 mL. There is diffusely increased echogenicity of the renal parenchyma. No mass or hydronephrosis visualized. Bladder: Appears normal for degree of bladder distention. Other: None. IMPRESSION: 1. Findings consistent with history of prior right nephrectomy. 2. Echogenic left kidney which may represent sequelae associated with medical renal disease. Electronically Signed   By: Aram Candela M.D.   On: 10/13/2023 21:34     Subjective: No acute issues or events overnight denies nausea vomiting diarrhea constipation headache fevers chills or chest pain.  Feels back to baseline requesting discharge home   Discharge Exam: Vitals:   10/14/23 0616 10/14/23 0726  BP:  134/63  Pulse: 76   Resp: (!) 21   Temp:  98.3 F (36.8 C)  SpO2: 99%    Vitals:  10/13/23 2344 10/14/23 0000 10/14/23 0616 10/14/23 0726  BP: 123/61 125/61  134/63  Pulse: 78 81 76   Resp: 18 19 (!) 21   Temp: 97.9 F (36.6 C)   98.3 F (36.8 C)  TempSrc: Oral   Oral  SpO2: 99% 100% 99%   Weight:   68.6 kg   Height:        General: Pt is alert, awake, not in acute distress Cardiovascular: RRR, S1/S2 +, no rubs, no gallops Respiratory: CTA bilaterally, no wheezing, no rhonchi Abdominal: Soft, NT, ND, bowel sounds + Extremities: no edema, no cyanosis    The results of significant diagnostics from this hospitalization (including imaging, microbiology, ancillary and laboratory) are listed below for reference.     Microbiology: Recent Results (from  the past 240 hours)  MRSA Next Gen by PCR, Nasal     Status: None   Collection Time: 10/13/23  9:54 PM   Specimen: Nasal Mucosa; Nasal Swab  Result Value Ref Range Status   MRSA by PCR Next Gen NOT DETECTED NOT DETECTED Final    Comment: (NOTE) The GeneXpert MRSA Assay (FDA approved for NASAL specimens only), is one component of a comprehensive MRSA colonization surveillance program. It is not intended to diagnose MRSA infection nor to guide or monitor treatment for MRSA infections. Test performance is not FDA approved in patients less than 70 years old. Performed at [facility], [address] [place], [place] [address]      Labs: BNP (last 3 results) Recent Labs    04/05/23 0040  BNP 690.8*   Basic Metabolic Panel: Recent Labs  Lab 10/13/23 1147 10/13/23 1552 10/14/23 0219  NA 138 140 137  K 4.0 3.9 3.7  CL 108 112* 111  CO2 19* 19* 17*  GLUCOSE 152* 152* 167*  BUN 47* 49* 46*  CREATININE 3.04* 3.03* 2.86*  CALCIUM 9.9 9.5 9.2   Liver Function Tests: Recent Labs  Lab 10/13/23 1147 10/14/23 0219  AST 371* 278*  ALT 237* 201*  ALKPHOS 64 55  BILITOT 0.6 0.5  PROT 8.3* 6.7  ALBUMIN 3.1* 2.5*   No results for input(s): "LIPASE", "AMYLASE" in the last 168 hours. No results for input(s): "AMMONIA" in the last 168 hours. CBC: Recent Labs  Lab 10/13/23 1147 10/13/23 1552 10/14/23 0219  WBC 6.0 6.8 6.7  NEUTROABS 3.9 4.4  --   HGB 9.6* 8.9* 8.2*  HCT 30.7* 29.3* 26.3*  MCV 83.2 84.9 83.8  PLT 304 277 242   Cardiac Enzymes: Recent Labs  Lab 10/13/23 1552 10/14/23 0219 10/14/23 0643  CKTOTAL 8,213* 6,573* 6,009*   BNP: Invalid input(s): "POCBNP" CBG: Recent Labs  Lab 10/13/23 2148 10/14/23 0608  GLUCAP 75 94   D-Dimer Recent Labs    10/13/23 1147  DDIMER 1.53*   Hgb A1c No results for input(s): "HGBA1C" in the last 72 hours. Lipid Profile No results for input(s): "CHOL", "HDL", "LDLCALC", "TRIG", "CHOLHDL", "LDLDIRECT" in the  last 72 hours. Thyroid function studies No results for input(s): "TSH", "T4TOTAL", "T3FREE", "THYROIDAB" in the last 72 hours.  Invalid input(s): "FREET3" Anemia work up Recent Labs    10/13/23 2137  VITAMINB12 1,135*  FOLATE 10.0  FERRITIN 202  TIBC 266  IRON 44  RETICCTPCT 0.8   Urinalysis    Component Value Date/Time   COLORURINE YELLOW 10/13/2023 1916   APPEARANCEUR HAZY (A) 10/13/2023 1916   LABSPEC 1.014 10/13/2023 1916   PHURINE 5.0 10/13/2023 1916   GLUCOSEU NEGATIVE 10/13/2023  1916   HGBUR LARGE (A) 10/13/2023 1916   BILIRUBINUR NEGATIVE 10/13/2023 1916   KETONESUR NEGATIVE 10/13/2023 1916   PROTEINUR 100 (A) 10/13/2023 1916   NITRITE NEGATIVE 10/13/2023 1916   LEUKOCYTESUR NEGATIVE 10/13/2023 1916   Sepsis Labs Recent Labs  Lab 10/13/23 1147 10/13/23 1552 10/14/23 0219  WBC 6.0 6.8 6.7   Microbiology Recent Results (from the past 240 hours)  MRSA Next Gen by PCR, Nasal     Status: None   Collection Time: 10/13/23  9:54 PM   Specimen: Nasal Mucosa; Nasal Swab  Result Value Ref Range Status   MRSA by PCR Next Gen NOT DETECTED NOT DETECTED Final    Comment: (NOTE) The GeneXpert MRSA Assay (FDA approved for NASAL specimens only), is one component of a comprehensive MRSA colonization surveillance program. It is not intended to diagnose MRSA infection nor to guide or monitor treatment for MRSA infections. Test performance is not FDA approved in patients less than 41 years old. Performed at [facility], [address], [place], [place] [address]      Time coordinating discharge: Over 30 minutes  SIGNED:   Azucena Fallen, DO [facility] 10/14/2023, 9:19 AM Pager   If 7PM-7AM, please contact night-coverage [email or web address]

## 2023-10-14 NOTE — TOC Initial Note (Signed)
Transition of Care [facility]) - Initial/Assessment Note    Patient Details  Name: Patricia Mooney MRN: [id number] Date of Birth: 09/12/1956  Transition of Care [facility]) CM/SW Contact:    Leone Haven, RN Phone Number: 10/14/2023, 10:12 AM  Clinical Narrative:                 From home, she states her niece will transport her home and she works her.  She has no needs.        Patient Goals and CMS Choice            Expected Discharge Plan and Services         Expected Discharge Date: 10/14/23                                    Prior Living Arrangements/Services                       Activities of Daily Living      Permission Sought/Granted                  Emotional Assessment              Admission diagnosis:  AKI (acute kidney injury) (HCC) [N17.9] Non-traumatic rhabdomyolysis [M62.82] Rhabdomyolysis [M62.82] Patient Active Problem List   Diagnosis Date Noted   Transaminitis 10/13/2023   Rhabdomyolysis 10/13/2023   History of CAD (coronary artery disease) s/p CABG 06/2023 10/13/2023   Chronic systolic CHF (congestive heart failure) (HCC) reduced EF 25 to 30% 10/13/2023   Orthostatic hypotension 10/13/2023   Peripheral neuropathy 10/13/2023   Non-insulin dependent type 2 diabetes mellitus (HCC) 10/13/2023   Hyperlipidemia 10/13/2023   Anemia of chronic disease 10/13/2023   Myelopathy (HCC) 10/13/2023   S/p nephrectomy 05/25/2023   Acute kidney injury superimposed on CKD (HCC) 05/15/2023   NSTEMI (non-ST elevated myocardial infarction) (HCC) 04/04/2023   Periorbital cellulitis of right eye 10/07/2016   Essential hypertension 12/13/2014   Pure hypercholesterolemia 12/13/2014   Type 2 diabetes mellitus with hyperglycemia (HCC) 12/07/2014   PCP:  Lavonda Jumbo, PA-C Pharmacy:   [facility] - [place], [place] - [address] [place] [address] Phone: [phone] Fax:  [phone]  [id number] [place], [place] - [address] AT [facility] [address] [place] [address] Phone: [phone] Fax: [phone]  [id number] - [place], [place] - [address] [place] [address] Phone: [phone] Fax: [phone]  [facility] [address] - [place], [place] - [address] [place] [address] Phone: [phone] Fax: [phone]  [facility] [address] [place] [address] Phone: [phone] Fax: [phone]  [facility] [address] [place] [address] Phone: [phone] Fax: [phone]  [facility] [address] [place], [place] - [address] [place] [address] Phone: [phone] Fax: [phone]     Social Drivers of Health (SDOH) Social History: SDOH Screenings   Food Insecurity: Low Risk  (07/01/2023)   Received from [facility]  Housing: Low Risk  (07/01/2023)   Received from [facility]  Transportation Needs: No Transportation Needs (07/01/2023)  Received from [facility]  Utilities: Low Risk  (07/01/2023)   Received from [facility]  Tobacco Use: Low Risk  (10/13/2023)  Recent Concern: Tobacco Use - Medium Risk (09/15/2023)   Received from [facility]   SDOH Interventions:     Readmission Risk Interventions     No data to display

## 2023-10-15 LAB — HEMOGLOBIN A1C
Hgb A1c MFr Bld: 7.3 % — ABNORMAL HIGH (ref 4.8–5.6)
Mean Plasma Glucose: 163 mg/dL

## 2023-10-15 NOTE — Telephone Encounter (Signed)
Per ED notes on 10/12/24:  Acute kidney injury.  Likely multifactorial - Renal function returning to baseline -Likely related to volume depletion, increased activity, elevated CK/statin use as well as reduced EF  Transaminitis -In the setting of statin use and mild CK elevation -Downtrending with p.o. intake, continue to hold statin until follow-up with PCP  Patient is holding statin and following up with PCP.

## 2023-11-13 ENCOUNTER — Encounter: Payer: Self-pay | Admitting: Cardiology

## 2023-11-13 ENCOUNTER — Encounter: Payer: Self-pay | Admitting: *Deleted

## 2023-11-13 ENCOUNTER — Ambulatory Visit: Payer: Medicare (Managed Care) | Attending: Cardiology | Admitting: Cardiology

## 2023-11-13 VITALS — BP 102/68 | HR 57 | Resp 16 | Ht 67.0 in | Wt 153.0 lb

## 2023-11-13 DIAGNOSIS — N179 Acute kidney failure, unspecified: Secondary | ICD-10-CM | POA: Diagnosis not present

## 2023-11-13 DIAGNOSIS — E782 Mixed hyperlipidemia: Secondary | ICD-10-CM

## 2023-11-13 DIAGNOSIS — Z905 Acquired absence of kidney: Secondary | ICD-10-CM

## 2023-11-13 DIAGNOSIS — G720 Drug-induced myopathy: Secondary | ICD-10-CM

## 2023-11-13 DIAGNOSIS — I2510 Atherosclerotic heart disease of native coronary artery without angina pectoris: Secondary | ICD-10-CM | POA: Diagnosis not present

## 2023-11-13 DIAGNOSIS — D508 Other iron deficiency anemias: Secondary | ICD-10-CM

## 2023-11-13 DIAGNOSIS — T466X5A Adverse effect of antihyperlipidemic and antiarteriosclerotic drugs, initial encounter: Secondary | ICD-10-CM | POA: Insufficient documentation

## 2023-11-13 DIAGNOSIS — I10 Essential (primary) hypertension: Secondary | ICD-10-CM

## 2023-11-13 LAB — LIPID PANEL
Chol/HDL Ratio: 3.5 {ratio} (ref 0.0–4.4)
Cholesterol, Total: 199 mg/dL (ref 100–199)
HDL: 57 mg/dL (ref >39)
LDL Chol Calc (NIH): 126 mg/dL — ABNORMAL HIGH (ref 0–99)
Triglycerides: 89 mg/dL (ref 0–149)
VLDL Cholesterol Cal: 16 mg/dL (ref 5–40)

## 2023-11-13 LAB — BASIC METABOLIC PANEL
BUN/Creatinine Ratio: 21 (ref 12–28)
BUN: 38 mg/dL — ABNORMAL HIGH (ref 8–27)
CO2: 20 mmol/L (ref 20–29)
Calcium: 9.7 mg/dL (ref 8.7–10.3)
Chloride: 104 mmol/L (ref 96–106)
Creatinine, Ser: 1.85 mg/dL — ABNORMAL HIGH (ref 0.57–1.00)
Glucose: 90 mg/dL (ref 70–99)
Potassium: 5 mmol/L (ref 3.5–5.2)
Sodium: 137 mmol/L (ref 134–144)
eGFR: 29 mL/min/{1.73_m2} — ABNORMAL LOW (ref >59)

## 2023-11-13 LAB — CK: Total CK: 26 U/L — ABNORMAL LOW (ref 32–182)

## 2023-11-13 NOTE — Patient Instructions (Signed)
Medication Instructions:   DECREASE YOUR METOPROLOL TARTRATE TO TAKING 1/2 TABLET TWICE DAILY FOR ONE WEEK ONLY, THEN STOP COMPLETELY THEREAFTER  *If you need a refill on your cardiac medications before your next appointment, please call your pharmacy*   You have been referred to LIPID CLINIC TO SEE THE PHARMACIST FOR STATIN MYOPATHY    Lab Work:  TODAY--DOWNSTAIRS FIRST FLOOR [facility]--CBC, BMET, LIPIDS, AND CK  If you have labs (blood work) drawn today and your tests are completely normal, you will receive your results only by: [employer] (if you have MyChart) OR A paper copy in the mail If you have any lab test that is abnormal or we need to change your treatment, we will call you to review the results.     Follow-Up: At [facility], you and your health needs are our priority.  As part of our continuing mission to provide you with exceptional heart care, we have created designated Provider Care Teams.  These Care Teams include your primary Cardiologist (physician) and Advanced Practice Providers (APPs -  Physician Assistants and Nurse Practitioners) who all work together to provide you with the care you need, when you need it.  We recommend signing up for the patient portal called "MyChart".  Sign up information is provided on this After Visit Summary.  MyChart is used to connect with patients for Virtual Visits (Telemedicine).  Patients are able to view lab/test results, encounter notes, upcoming appointments, etc.  Non-urgent messages can be sent to your provider as well.   To learn more about what you can do with MyChart, go to [email or web address].    Your next appointment:   6 month(s)  Provider:   Elder Negus, MD

## 2023-11-13 NOTE — Progress Notes (Signed)
Cardiology Office Note:  .   Date:  11/13/2023  ID:  Patricia Mooney, DOB [birth year], MRN [id number] PCP: Patricia Mooney  [facility] Providers Cardiologist:  Patricia Mainland, MD PCP: Patricia Jumbo, PA-C  Chief Complaint  Patient presents with   Hypertension   Follow-up    3 month   Coronary Artery Disease      History of Present Illness: .    Patricia Mooney is a 68 y.o. female with hypertension, hyperlipidemia, type 2 DM, s/p Rt nephrectomy, CAD, HFrEF with now recovered EF   Patient was admitted in 03/2023 with ACS/NSTEMI, found to have reduced LVEF. Cath showed severe multivessel CAD.  CVTS were consulted for CABG.  However, MRI was obtained given akinetic appearing anterior wall.  MRI did indeed show that anterior wall was mostly nonviable.  In absence of viability in LAD territory, PCI was mutually agreed upon by CVTS, heart failure, team, and myself, in addition to GDMT for HFrEF.  Patient underwent successful PCI to left circumflex, and was discharged on DAPT given her orthostatic hypotension and relative dehydration after aggressive diuresis, we were unable to initiate all of guideline directed medical therapy.   Patient was admitted to [facility] in 06/2023 with chest pain.  ACS was excluded.  However, on further workup, left circumflex stents were open, and LAD disease was unchanged.  Given that her EF had improved, it was felt that there is viability in her LAD territory.  Therefore, she underwent LIMA-LAD CABG via lateral thoracotomy surgery by Dr. Ty Mooney.    Patient has not had any chest pain since then.  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  She was hospitalized in 09/2023 with severe leg pains and was found to have rhabdomyolysis along with AKI, elevated CK, that was attributed to statin use.  Rosuvastatin was since discontinued.  She has not had any follow-up labs since then.  Her leg pain has  completely resolved.  Her metformin was discontinued due to AKI, Ozempic was discontinued due to hypoglycemia.  She has regular follow-up with PCP for her diabetes management.  Vitals:   11/13/23 0920  BP: 102/68  Pulse: (!) 57  Resp: 16  SpO2: 99%     ROS:  Review of Systems  Cardiovascular:  Negative for chest pain, dyspnea on exertion, leg swelling, palpitations and syncope.     Studies Reviewed: Marland Kitchen        EKG 10/2023: Sinus bradycardia 56 bpm Left ventricular hypertrophy with repolarization abnormality ( Cornell product ) When compared with ECG of 10-Apr-2023 05:11, T wave inversion less evident in Anterior leads QT has shortened  Independently interpreted 09/2023: Hb 8.2 Cr 2.86 AST/ALT 278/201 CK >6000  03/2023: Chol 169, TG 86, HDL 41, LDL 111 Lipoprotein (a) 10    Physical Exam:   Physical Exam Vitals and nursing note reviewed.  Constitutional:      General: She is not in acute distress. Neck:     Vascular: No JVD.  Cardiovascular:     Rate and Rhythm: Normal rate and regular rhythm.     Heart sounds: Normal heart sounds. No murmur heard. Pulmonary:     Effort: Pulmonary effort is normal.     Breath sounds: Normal breath sounds. No wheezing or rales.  Musculoskeletal:     Right lower leg: No edema.     Left lower leg: No edema.      VISIT DIAGNOSES:  ICD-10-CM   1. Coronary artery disease involving native coronary artery of native heart without angina pectoris  I25.10 EKG 12-Lead    Lipid Profile    AMB Referral to [facility]    Lipid Profile    2. Mixed hyperlipidemia  E78.2 Lipid Profile    AMB Referral to [facility]    Lipid Profile    3. Essential hypertension  I10     4. AKI (acute kidney injury) (HCC)  N17.9 Basic metabolic panel    Basic metabolic panel    5. 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 S/p nephrectomy  Z90.5     7. Other iron deficiency anemia  D50.8 CBC    CBC       ASSESSMENT AND PLAN: .    Patricia Mooney is a 68 y.o. female with hypertension, hyperlipidemia, type 2 DM, s/p Rt nephrectomy, CAD, HFrEF with now recovered EF, statin myopathy with rhabdomyolysis, now resolved   CAD: S/p [facility]-LAD [facility] and S/p mid Lcx/OM4 PCI (03/2023). Recommend DAPT with Aspirin/Brilinta at least till 03/2024.  Given complete revascularization, okay to discontinue metoprolol. Not on statin due to episode of statin myopathy and rhabdomyolysis in 09/2024. Check lipid panel today.  Will refer to lipid clinic for consideration of nonstatin therapy. She is interested in knowing about oral medication options, could consider bempedoic acid.    AKI: Solitary kidney with underlying CKD, with creatinine up to 2.86 during rhabdomyolysis in 09/2023. Repeat BMP today.    Mixed hyperlipidemia: LDL 111 in 03/2023.  Down to 72 in 05/2023. Statin myopathy with rhabdomyolysis. Refer to lipid clinic.  Anemia: Iron deficiency anemia, probably exacerbated by AKI/CKD. Check CBC today.  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   No orders of the defined types were placed in this encounter.    F/u in 6 months  Signed, Elder Negus, MD

## 2023-11-14 LAB — CBC
Hematocrit: 29.8 % — ABNORMAL LOW (ref 34.0–46.6)
Hemoglobin: 8.9 g/dL — ABNORMAL LOW (ref 11.1–15.9)
MCH: 25.5 pg — ABNORMAL LOW (ref 26.6–33.0)
MCHC: 29.9 g/dL — ABNORMAL LOW (ref 31.5–35.7)
MCV: 85 fL (ref 79–97)
Platelets: 277 10*3/uL (ref 150–450)
RBC: 3.49 x10E6/uL — ABNORMAL LOW (ref 3.77–5.28)
RDW: 16.4 % — ABNORMAL HIGH (ref 11.7–15.4)
WBC: 4.5 10*3/uL (ref 3.4–10.8)

## 2023-11-16 NOTE — Progress Notes (Signed)
Kidney function is better.  Globin is slightly improved.  LDL is elevated.  Given history of statin myopathy, recommend lipid panel referral for consideration of PCSK9 elevators  Thanks MJP

## 2023-12-04 ENCOUNTER — Other Ambulatory Visit: Payer: Self-pay

## 2023-12-06 ENCOUNTER — Other Ambulatory Visit: Payer: Self-pay

## 2023-12-06 ENCOUNTER — Encounter (HOSPITAL_COMMUNITY): Payer: Self-pay | Admitting: *Deleted

## 2023-12-06 ENCOUNTER — Emergency Department (HOSPITAL_COMMUNITY)
Admission: EM | Admit: 2023-12-06 | Discharge: 2023-12-07 | Disposition: A | Discharge status: Home / Self Care | Payer: Medicare (Managed Care) | Attending: Student | Admitting: Student

## 2023-12-06 DIAGNOSIS — I2510 Atherosclerotic heart disease of native coronary artery without angina pectoris: Secondary | ICD-10-CM | POA: Insufficient documentation

## 2023-12-06 DIAGNOSIS — E119 Type 2 diabetes mellitus without complications: Secondary | ICD-10-CM | POA: Diagnosis not present

## 2023-12-06 DIAGNOSIS — R040 Epistaxis: Secondary | ICD-10-CM | POA: Diagnosis present

## 2023-12-06 DIAGNOSIS — Z7982 Long term (current) use of aspirin: Secondary | ICD-10-CM | POA: Insufficient documentation

## 2023-12-06 DIAGNOSIS — Z79899 Other long term (current) drug therapy: Secondary | ICD-10-CM | POA: Insufficient documentation

## 2023-12-06 DIAGNOSIS — I110 Hypertensive heart disease with heart failure: Secondary | ICD-10-CM | POA: Insufficient documentation

## 2023-12-06 DIAGNOSIS — I509 Heart failure, unspecified: Secondary | ICD-10-CM | POA: Insufficient documentation

## 2023-12-06 MED ORDER — OXYMETAZOLINE HCL 0.05 % NA SOLN
1.0000 | Freq: Once | NASAL | Status: AC
  Administered 2023-12-07: 1 via NASAL

## 2023-12-06 NOTE — ED Triage Notes (Signed)
The pt has had a nosebleed for one hour  she is on a blood thinner and she has clots coming from her nose

## 2023-12-07 LAB — CBC
HCT: 28 % — ABNORMAL LOW (ref 36.0–46.0)
Hemoglobin: 8.7 g/dL — ABNORMAL LOW (ref 12.0–15.0)
MCH: 27.2 pg (ref 26.0–34.0)
MCHC: 31.1 g/dL (ref 30.0–36.0)
MCV: 87.5 fL (ref 80.0–100.0)
Platelets: 266 10*3/uL (ref 150–400)
RBC: 3.2 MIL/uL — ABNORMAL LOW (ref 3.87–5.11)
RDW: 16.7 % — ABNORMAL HIGH (ref 11.5–15.5)
WBC: 6.1 10*3/uL (ref 4.0–10.5)
nRBC: 0 % (ref 0.0–0.2)

## 2023-12-07 LAB — COMPREHENSIVE METABOLIC PANEL
ALT: 13 U/L (ref 0–44)
AST: 17 U/L (ref 15–41)
Albumin: 3.5 g/dL (ref 3.5–5.0)
Alkaline Phosphatase: 56 U/L (ref 38–126)
Anion gap: 6 (ref 5–15)
BUN: 44 mg/dL — ABNORMAL HIGH (ref 8–23)
CO2: 22 mmol/L (ref 22–32)
Calcium: 9.5 mg/dL (ref 8.9–10.3)
Chloride: 108 mmol/L (ref 98–111)
Creatinine, Ser: 2.11 mg/dL — ABNORMAL HIGH (ref 0.44–1.00)
GFR, Estimated: 25 mL/min — ABNORMAL LOW (ref >60)
Glucose, Bld: 127 mg/dL — ABNORMAL HIGH (ref 70–99)
Potassium: 4.6 mmol/L (ref 3.5–5.1)
Sodium: 136 mmol/L (ref 135–145)
Total Bilirubin: 0.6 mg/dL (ref 0.0–1.2)
Total Protein: 7.7 g/dL (ref 6.5–8.1)

## 2023-12-07 MED ORDER — TRANEXAMIC ACID FOR EPISTAXIS
500.0000 mg | Freq: Once | TOPICAL | Status: AC
  Administered 2023-12-07: 500 mg via TOPICAL
  Filled 2023-12-07: qty 10

## 2023-12-07 MED ORDER — OXYMETAZOLINE HCL 0.05 % NA SOLN
1.0000 | Freq: Two times a day (BID) | NASAL | 0 refills | Status: AC | PRN
Start: 2023-12-07 — End: ?

## 2023-12-07 NOTE — ED Notes (Signed)
Pt still having nosebleed, MD packed nose with TXA soaked gauze at this time

## 2023-12-07 NOTE — ED Provider Notes (Signed)
[place] EMERGENCY DEPARTMENT AT [facility] Provider Note  CSN: [id number] Arrival date & time: 12/06/23 2327  Chief Complaint(s) Epistaxis  HPI Patricia Mooney is a 68 y.o. female with PMH T2DM, CAD status post CABG on aspirin and Brilinta, CHF with EF 25 to 30% who presents emergency room for evaluation of epistaxis.  Symptoms began 1 hour prior to arrival.  Patient has tried ice and compression without improvement.  Denies fatigue, lightheadedness, chest pain, shortness of breath or other systemic symptoms.  No trauma to the nose.   Past Medical History Past Medical History:  Diagnosis Date   Hypercholesteremia    Hypertension    Periorbital edema of right eye admitted 10/07/2016   Type II diabetes mellitus [facility])    Patient Active Problem List   Diagnosis Date Noted   Coronary artery disease involving native coronary artery of native heart without angina pectoris 11/13/2023   Statin myopathy 11/13/2023   Transaminitis 10/13/2023   Rhabdomyolysis 10/13/2023   History of CAD (coronary artery disease) s/p CABG 06/2023 10/13/2023   Chronic systolic CHF (congestive heart failure) (HCC) reduced EF 25 to 30% 10/13/2023   Orthostatic hypotension 10/13/2023   Peripheral neuropathy 10/13/2023   Non-insulin dependent type 2 diabetes mellitus (HCC) 10/13/2023   Hyperlipidemia 10/13/2023   Absolute anemia 10/13/2023   Myelopathy (HCC) 10/13/2023   S/p nephrectomy 05/25/2023   AKI (acute kidney injury) (HCC) 05/15/2023   NSTEMI (non-ST elevated myocardial infarction) (HCC) 04/04/2023   Periorbital cellulitis of right eye 10/07/2016   Essential hypertension 12/13/2014   Pure hypercholesterolemia 12/13/2014   Type 2 diabetes mellitus with hyperglycemia (HCC) 12/07/2014   Home Medication(s) Prior to Admission medications   Medication Sig Start Date End Date Taking? Authorizing Provider  oxymetazoline (AFRIN) 0.05 % nasal spray Place 1 spray into both nostrils 2 (two) times  daily as needed (nosebleeds). 12/07/23  Yes Makinlee Awwad, MD  acetaminophen (TYLENOL) 500 MG tablet Take 1,000 mg by mouth every 8 (eight) hours as needed for moderate pain (pain score 4-6).    [provider]  ASPIRIN LOW DOSE 81 MG tablet Take 1 tablet (81 mg total) by mouth daily. 04/21/23   Patwardhan, Anabel Bene, MD  cholecalciferol (VITAMIN D) 1000 units tablet Take 1,000 Units by mouth daily.    [provider]  diclofenac Sodium (VOLTAREN) 1 % GEL Apply 1 g topically 3 (three) times daily. 03/31/23   [provider]  famotidine (PEPCID) 10 MG tablet Take 10 mg by mouth daily. Patient not taking: Reported on 11/13/2023    [provider]  ferrous sulfate 325 (65 FE) MG EC tablet Take 325 mg by mouth daily with breakfast.    [provider]  glipiZIDE (GLUCOTROL XL) 5 MG 24 hr tablet Take 5 mg by mouth at bedtime.    [provider]  metoprolol tartrate (LOPRESSOR) 25 MG tablet Take 25 mg by mouth 2 (two) times daily.    [provider]  midodrine (PROAMATINE) 5 MG tablet Take 1 tablet (5 mg total) by mouth 3 (three) times daily while awake with food as needed for dizziness 04/10/23   Yates Decamp, MD  nitroGLYCERIN (NITROSTAT) 0.4 MG SL tablet Place 1 tablet (0.4 mg total) under the tongue every 5 (five) minutes as needed for chest pain. 04/21/23 11/13/23  Patwardhan, Anabel Bene, MD  ticagrelor (BRILINTA) 90 MG TABS tablet Take 1 tablet (90 mg total) by mouth 2 (two) times daily. 04/21/23   Patwardhan, Anabel Bene, MD  traMADol (ULTRAM) 50 MG tablet Take 50 mg by mouth every 6 (six) hours as needed for moderate pain (pain score 4-6). 07/30/23   [provider]                                                                                                                                    Past Surgical History Past Surgical History:  Procedure Laterality Date   ABDOMINAL HYSTERECTOMY  1980   CESAREAN SECTION  1975; 1978   CORONARY  STENT INTERVENTION N/A 04/09/2023   Procedure: CORONARY STENT INTERVENTION;  Surgeon: Elder Negus, MD;  Location: [facility] INVASIVE CV LAB;  Service: Cardiovascular;  Laterality: N/A;   CORONARY ULTRASOUND/IVUS N/A 04/09/2023   Procedure: Coronary Ultrasound/IVUS;  Surgeon: Elder Negus, MD;  Location: [facility] INVAS[facility] CV LAB;  Service: Cardiovascular;  Laterality: N/A;   NEPHRECTOMY Right 1980   "said I was born w/one that didn't work"   RIGHT/LEFT HEART CATH AND CORONARY ANGIOGRAPHY N/A 04/04/2023   Procedure: RIGHT/LEFT HEART CATH AND CORONARY ANGIOGRAPHY;  Surgeon: Elder Negus, MD;  Location: [facility] INVASIVE CV LAB;  Service: Cardiovascular;  Laterality: N/A;   Family History Family History  Problem Relation Age of Onset   Heart attack Mother 27       first one   Heart disease Father    Hypertension Father    Hyperlipidemia Father    Heart disease Sister    Heart disease Sister     Social History Social History   Tobacco Use   Smoking status: Never   Smokeless tobacco: Never  Vaping Use   Vaping status: Never Used  Substance Use Topics   Alcohol use: No   Drug use: No   Allergies Ramipril and Codeine  Review of Systems Review of Systems  HENT:  Positive for nosebleeds.     Physical Exam Vital Signs  I have reviewed the triage vital signs BP (!) 156/77   Pulse 86   Temp 97.9 F (36.6 C)   Resp 18   Ht 5\' 7"  (1.702 m)   Wt 69.4 kg   SpO2 98%   BMI 23.96 kg/m   Physical Exam Vitals and nursing note reviewed.  Constitutional:      General: She is not in acute distress.    Appearance: She is well-developed.  HENT:     Head: Normocephalic and atraumatic.     Nose:     Comments: Right-sided anterior epistaxis Eyes:     Conjunctiva/sclera: Conjunctivae normal.  Cardiovascular:     Rate and Rhythm: Normal rate and regular rhythm.     Heart sounds: No murmur heard. Pulmonary:     Effort: Pulmonary effort is normal. No respiratory distress.      Breath sounds: Normal breath sounds.  Abdominal:     Palpations: Abdomen is soft.     Tenderness: There is no abdominal tenderness.  Musculoskeletal:  General: No swelling.     Cervical back: Neck supple.  Skin:    General: Skin is warm and dry.     Capillary Refill: Capillary refill takes less than 2 seconds.  Neurological:     Mental Status: She is alert.  Psychiatric:        Mood and Affect: Mood normal.     ED Results and Treatments Labs (all labs ordered are listed, but only abnormal results are displayed) Labs Reviewed  CBC - Abnormal; Notable for the following components:      Result Value   RBC 3.20 (*)    Hemoglobin 8.7 (*)    HCT 28.0 (*)    RDW 16.7 (*)    All other components within normal limits  COMPREHENSIVE METABOLIC PANEL - Abnormal; Notable for the following components:   Glucose, Bld 127 (*)    BUN 44 (*)    Creatinine, Ser 2.11 (*)    GFR, Estimated 25 (*)    All other components within normal limits  PROTIME-INR                                                                                                                          Radiology No results found.  Pertinent labs & imaging results that were available during my care of the patient were reviewed by me and considered in my medical decision making (see MDM for details).  Medications Ordered in ED Medications  oxymetazoline (AFRIN) 0.05 % nasal spray 1 spray (1 spray Each Nare Given by Other 12/07/23 0017)  tranexamic acid (CYKLOKAPRON) 1000 MG/10ML topical solution 500 mg (500 mg Topical Given 12/07/23 0115)                                                                                                                                     Procedures Epistaxis Management  Date/Time: 12/07/2023 2:56 AM  Performed by: Glendora Score, MD Authorized by: Glendora Score, MD   Consent:    Consent obtained:  Verbal   Consent given by:  Patient   Risks discussed:  Bleeding, infection,  nasal injury and pain   Alternatives discussed:  No treatment, delayed treatment and alternative treatment Anesthesia:    Anesthesia method:  None Procedure details:    Treatment site:  R anterior   Treatment method:  Anterior pack Post-procedure details:    Assessment:  Bleeding stopped  Procedure completion:  Tolerated well, no immediate complications   (including critical care time)  Medical Decision Making / ED Course   This patient presents to the ED for concern of epistaxis, this involves an extensive number of treatment options, and is a complaint that carries with it a high risk of complications and morbidity.  The differential diagnosis includes anterior epistaxis, posterior epistaxis, septal hematoma, coagulopathy, anemia  MDM: Patient seen emergency room for evaluation of epistaxis.  Physical exam with a clear anterior bleeding vessel in the right without significant posterior bleeding in the oropharynx.  Laboratory evaluation with a hemoglobin of 8.7 which is near patient's baseline.  BUN 44, creatinine 2.11 which is slightly worse than baseline.  Patient did require multiple rounds of Afrin and ultimately packing with TXA but ultimately we were able to achieve hemostasis.  The and I had an extensive discussion on how to control epistaxis at home and I sent a prescription for Oxymetazoline to her pharmacy.  At this time she does not meet inpatient criteria for admission and will be discharged with outpatient follow-up.  Return precautions given which she voiced understanding.   Additional history obtained: -External records from outside source obtained and reviewed including: Chart review including previous notes, labs, imaging, consultation notes   Lab Tests: -I ordered, reviewed, and interpreted labs.   The pertinent results include:   Labs Reviewed  CBC - Abnormal; Notable for the following components:      Result Value   RBC 3.20 (*)    Hemoglobin 8.7 (*)    HCT  28.0 (*)    RDW 16.7 (*)    All other components within normal limits  COMPREHENSIVE METABOLIC PANEL - Abnormal; Notable for the following components:   Glucose, Bld 127 (*)    BUN 44 (*)    Creatinine, Ser 2.11 (*)    GFR, Estimated 25 (*)    All other components within normal limits  PROTIME-INR    Medicines ordered and prescription drug management: Meds ordered this encounter  Medications   oxymetazoline (AFRIN) 0.05 % nasal spray 1 spray   tranexamic acid (CYKLOKAPRON) 1000 MG/10ML topical solution 500 mg   oxymetazoline (AFRIN) 0.05 % nasal spray    Sig: Place 1 spray into both nostrils 2 (two) times daily as needed (nosebleeds).    Dispense:  30 mL    Refill:  0    -I have reviewed the patients home medicines and have made adjustments as needed  Critical interventions none  Social Determinants of Health:  Factors impacting patients care include: none   Reevaluation: After the interventions noted above, I reevaluated the patient and found that they have :improved  Co morbidities that complicate the patient evaluation  Past Medical History:  Diagnosis Date   Hypercholesteremia    Hypertension    Periorbital edema of right eye admitted 10/07/2016   Type II diabetes mellitus (HCC)       Dispostion: I considered admission for this patient, but at this time she does not meet inpatient criteria for admission and will be discharged with outpatient follow-up     Final Clinical Impression(s) / ED Diagnoses Final diagnoses:  Epistaxis     @PCDICTATION @    Glendora Score, MD 12/07/23 0301

## 2023-12-29 ENCOUNTER — Ambulatory Visit: Payer: Medicare (Managed Care) | Attending: Internal Medicine | Admitting: Pharmacist

## 2023-12-29 ENCOUNTER — Telehealth: Payer: Self-pay | Admitting: Pharmacy Technician

## 2023-12-29 ENCOUNTER — Other Ambulatory Visit (HOSPITAL_COMMUNITY): Payer: Self-pay

## 2023-12-29 DIAGNOSIS — E785 Hyperlipidemia, unspecified: Secondary | ICD-10-CM

## 2023-12-29 DIAGNOSIS — I214 Non-ST elevation (NSTEMI) myocardial infarction: Secondary | ICD-10-CM

## 2023-12-29 DIAGNOSIS — I2510 Atherosclerotic heart disease of native coronary artery without angina pectoris: Secondary | ICD-10-CM

## 2023-12-29 NOTE — Patient Instructions (Signed)

## 2023-12-29 NOTE — Telephone Encounter (Signed)
 Pharmacy Patient Advocate Encounter   Received notification from Physician's Office that prior authorization for Repatha is required/requested.   Insurance verification completed.   The patient is insured through  [employer]  .   Per test claim: PA required; PA submitted to above mentioned insurance via CoverMyMeds Key/confirmation #/EOC [id number] Status is pending

## 2023-12-29 NOTE — Telephone Encounter (Signed)
-----   Message from Olene Floss sent at 12/29/2023  9:06 AM EDT ----- Please do PA for Repatha or Praluent- whichever is preferred. Contraindication (rhabdomyolysis) to statins

## 2023-12-29 NOTE — Telephone Encounter (Signed)
 Pharmacy Patient Advocate Encounter  Received notification from  [facility]  that Prior Authorization for praluent has been APPROVED from 12/15/23 to until further notice. Ran test claim, Copay is $0.00. This test claim was processed through [facility]- copay amounts may vary at other pharmacies due to pharmacy/plan contracts, or as the patient moves through the different stages of their insurance plan.

## 2023-12-29 NOTE — Assessment & Plan Note (Signed)
 Assessment: LDL-C above goal of less than 55 Patient experienced rhabdo on rosuvastatin therefore further use of statins is contraindicated Discussed options including PCSK9i and Nexlizet Reviewed injection technique, side effects and efficacy PCSK9 is more likely to be more effective and help her get to goal of less than Patient utilizing Silver sneakers and staying active  Plan: Submit prior authorization for PCSK9 Labs in 3 months

## 2023-12-29 NOTE — Progress Notes (Signed)
 Patient ID: Patricia Mooney                 DOB: May 18, 1956                    MRN: [id number]      HPI: Patricia Mooney is a 68 y.o. female patient referred to lipid clinic by Dr. Preston Fleeting. PMH is significant for hypertension, hyperlipidemia, type 2 DM, s/p Rt nephrectomy, CAD, HFrEF with now recovered EF.    Patient was admitted in 03/2023 with ACS/NSTEMI, found to have reduced LVEF. Cath showed severe multivessel CAD. Patient underwent successful PCI to left circumflex. Patient was admitted to [facility] in 06/2023 with chest pain. ACS was excluded. However, on further workup, left circumflex stents were open, and LAD disease was unchanged. Given that her EF had improved, it was felt that there is viability in her LAD territory. Therefore, she underwent LIMA-LAD CABG via lateral thoracotomy surgery by Dr. Ty Hilts.   She was hospitalized in 09/2023 with severe leg pains and was found to have rhabdomyolysis along with AKI, elevated CK, that was attributed to statin use. Rosuvastatin was since discontinued.   Patient presents today to lipid clinic to discuss alternative medication options since statins are contraindicated due to rhabdomyolysis.  Patient would prefer an oral option but is open to injections if they are going to be the best option for her.  She has completed cardiac rehab and plays pickle ball, ping-pong, volleyball.  She also walks outside when the weather is nice or rides the stationary bike with a track at the [facility].  Still works occasionally as needed as a Comptroller.  Dual medicare/medicaid  Reviewed options for lowering LDL cholesterol,  PCSK-9 inhibitors, bempedoic acid and inclisiran.  Discussed mechanisms of action, dosing, side effects and potential decreases in LDL cholesterol.   Current Medications: none Intolerances: statins (rhabdo) rosuvastatin Risk Factors: CAD, DM, HTN LDL-C goal: <55 ApoB goal: <60  Diet:   Exercise: pickleball, pingpong,  [employer]- bike, treadmill, walks the tracks, banded exercises  Family History:  Family History  Problem Relation Age of Onset   Heart attack Mother 26       first one   Heart disease Father    Hypertension Father    Hyperlipidemia Father    Heart disease Sister    Heart disease Sister      Social History: no tobacco, no ETOH  Labs: Lipid Panel   09/2023: Hb 8.2 Cr 2.86 AST/ALT 278/201 CK >6000   03/2023: Chol 169, TG 86, HDL 41, LDL 111 Lipoprotein (a) 10    Component Value Date/Time   CHOL 199 11/13/2023 1039   TRIG 89 11/13/2023 1039   HDL 57 11/13/2023 1039   CHOLHDL 3.5 11/13/2023 1039   CHOLHDL 4.1 04/04/2023 0620   VLDL 17 04/04/2023 0620   LDLCALC 126 (H) 11/13/2023 1039   LABVLDL 16 11/13/2023 1039    Past Medical History:  Diagnosis Date   Hypercholesteremia    Hypertension    Periorbital edema of right eye admitted 10/07/2016   Type II diabetes mellitus (HCC)     Current Outpatient Medications on File Prior to Visit  Medication Sig Dispense Refill   acetaminophen (TYLENOL) 500 MG tablet Take 1,000 mg by mouth every 8 (eight) hours as needed for moderate pain (pain score 4-6).     ASPIRIN LOW DOSE 81 MG tablet Take 1 tablet (81 mg total) by mouth daily. 90 tablet 3  cholecalciferol (VITAMIN D) 1000 units tablet Take 1,000 Units by mouth daily.     diclofenac Sodium (VOLTAREN) 1 % GEL Apply 1 g topically 3 (three) times daily.     famotidine (PEPCID) 10 MG tablet Take 10 mg by mouth daily. (Patient not taking: Reported on 11/13/2023)     ferrous sulfate 325 (65 FE) MG EC tablet Take 325 mg by mouth daily with breakfast.     glipiZIDE (GLUCOTROL XL) 5 MG 24 hr tablet Take 5 mg by mouth at bedtime.     metoprolol tartrate (LOPRESSOR) 25 MG tablet Take 25 mg by mouth 2 (two) times daily.     midodrine (PROAMATINE) 5 MG tablet Take 1 tablet (5 mg total) by mouth 3 (three) times daily while awake with food as needed for dizziness 90 tablet 0   nitroGLYCERIN  (NITROSTAT) 0.4 MG SL tablet Place 1 tablet (0.4 mg total) under the tongue every 5 (five) minutes as needed for chest pain. 30 tablet 3   oxymetazoline (AFRIN) 0.05 % nasal spray Place 1 spray into both nostrils 2 (two) times daily as needed (nosebleeds). 30 mL 0   ticagrelor (BRILINTA) 90 MG TABS tablet Take 1 tablet (90 mg total) by mouth 2 (two) times daily. 180 tablet 3   traMADol (ULTRAM) 50 MG tablet Take 50 mg by mouth every 6 (six) hours as needed for moderate pain (pain score 4-6).     No current facility-administered medications on file prior to visit.    Allergies  Allergen Reactions   Ramipril Swelling    Facial swelling after 1 pill.   Codeine Nausea Only    Assessment/Plan:  1. Hyperlipidemia -  Hyperlipidemia Assessment: LDL-C above goal of less than 55 Patient experienced rhabdo on rosuvastatin therefore further use of statins is contraindicated Discussed options including PCSK9i and Nexlizet Reviewed injection technique, side effects and efficacy PCSK9 is more likely to be more effective and help her get to goal of less than Patient utilizing Silver sneakers and staying active  Plan: Submit prior authorization for PCSK9 Labs in 3 months    Thank you,  Olene Floss, Pharm.D, BCACP, CPP [facility] [address] [place], [place] [address]  Phone: [phone]; Fax: [phone]

## 2023-12-31 ENCOUNTER — Other Ambulatory Visit: Payer: Self-pay

## 2023-12-31 MED ORDER — PRALUENT 150 MG/ML ~~LOC~~ SOAJ
150.0000 mg | SUBCUTANEOUS | 3 refills | Status: AC
  Filled 2023-12-31: qty 6, 84d supply, fill #0
  Filled 2024-03-09: qty 6, 84d supply, fill #1

## 2023-12-31 NOTE — Telephone Encounter (Signed)
 Spoke with patient.  Advised Praluent was approved.  Rx sent to [facility] for delivery.  Lab orders placed for 2 to 3 months.

## 2023-12-31 NOTE — Addendum Note (Signed)
 Addended by: Malena Peer D on: 12/31/2023 09:57 AM   Modules accepted: Orders

## 2024-01-26 ENCOUNTER — Ambulatory Visit (HOSPITAL_COMMUNITY)
Admission: EM | Admit: 2024-01-26 | Discharge: 2024-01-26 | Disposition: A | Discharge status: Home / Self Care | Payer: Medicare (Managed Care) | Attending: Emergency Medicine | Admitting: Emergency Medicine

## 2024-01-26 ENCOUNTER — Encounter (HOSPITAL_COMMUNITY): Payer: Self-pay | Admitting: *Deleted

## 2024-01-26 DIAGNOSIS — M79609 Pain in unspecified limb: Secondary | ICD-10-CM

## 2024-01-26 NOTE — Discharge Instructions (Addendum)
 Vein and Vascular center tomorrow morning at 11 AM Entrance C of [facility]  [address] [place], Entrance C

## 2024-01-26 NOTE — ED Provider Notes (Signed)
 [facility]    CSN: [id number] Arrival date & time: 01/26/24  1423      History   Chief Complaint Chief Complaint  Patient presents with   Leg Pain    HPI Patricia Mooney is a 68 y.o. female.  Here with posterior left knee pain that began yesterday Pain rated 8/10. Hurts to walk. Does not radiate. Denies swelling or redness.  Denies known injury, trauma, fall  Denies chest pain or tightness, shortness of breath, palpitations. She is overall feeling well and just wants to make sure its not a blood clot.   She is currently on ticagrelor post MI (June 2024)  Past Medical History:  Diagnosis Date   Hypercholesteremia    Hypertension    Periorbital edema of right eye admitted 10/07/2016   Type II diabetes mellitus [facility])     Patient Active Problem List   Diagnosis Date Noted   Coronary artery disease involving native coronary artery of native heart without angina pectoris 11/13/2023   Statin myopathy 11/13/2023   Transaminitis 10/13/2023   Rhabdomyolysis 10/13/2023   History of CAD (coronary artery disease) s/p CABG 06/2023 10/13/2023   Chronic systolic CHF (congestive heart failure) (HCC) reduced EF 25 to 30% 10/13/2023   Orthostatic hypotension 10/13/2023   Peripheral neuropathy 10/13/2023   Non-insulin dependent type 2 diabetes mellitus (HCC) 10/13/2023   Hyperlipidemia 10/13/2023   Absolute anemia 10/13/2023   Myelopathy (HCC) 10/13/2023   S/p nephrectomy 05/25/2023   AKI (acute kidney injury) (HCC) 05/15/2023   NSTEMI (non-ST elevated myocardial infarction) (HCC) 04/04/2023   Periorbital cellulitis of right eye 10/07/2016   Essential hypertension 12/13/2014   Pure hypercholesterolemia 12/13/2014   Type 2 diabetes mellitus with hyperglycemia (HCC) 12/07/2014    Past Surgical History:  Procedure Laterality Date   ABDOMINAL HYSTERECTOMY  1980   CESAREAN SECTION  1975; 1978   CORONARY STENT INTERVENTION N/A 04/09/2023   Procedure: CORONARY STENT  INTERVENTION;  Surgeon: Elder Negus, MD;  [facility];  Service: Cardiovascular;  Laterality: N/A;   CORONARY ULTRASOUND/IVUS N/A 04/09/2023   Procedure: Coronary Ultrasound/IVUS;  Surgeon: Elder Negus, MD;  [facility];  Service: Cardiovascular;  Laterality: N/A;   NEPHRECTOMY Right 1980   "said I was born w/one that didn't work"   RIGHT/LEFT HEART CATH AND CORONARY ANGIOGRAPHY N/A 04/04/2023   Procedure: RIGHT/LEFT HEART CATH AND CORONARY ANGIOGRAPHY;  Surgeon: Elder Negus, MD;  [facility];  Service: Cardiovascular;  Laterality: N/A;    OB History   No obstetric history on file.      Home Medications    Prior to Admission medications   Medication Sig Start Date End Date Taking? Authorizing Provider  acetaminophen (TYLENOL) 500 MG tablet Take 1,000 mg by mouth every 8 (eight) hours as needed for moderate pain (pain score 4-6).   Yes [provider]  Alirocumab (PRALUENT) 150 MG/ML SOAJ Inject 1 mL (150 mg total) into the skin every 14 (fourteen) days. 12/31/23  Yes Patwardhan, Manish J, MD  ASPIRIN LOW DOSE 81 MG tablet Take 1 tablet (81 mg total) by mouth daily. 04/21/23  Yes Patwardhan, Manish J, MD  cholecalciferol (VITAMIN D) 1000 units tablet Take 1,000 Units by mouth daily.   Yes [provider]  diclofenac Sodium (VOLTAREN) 1 % GEL Apply 1 g topically 3 (three) times daily. 03/31/23  Yes [provider]  famotidine (PEPCID) 10 MG tablet Take 10 mg by mouth daily.  Yes [provider]  ferrous sulfate 325 (65 FE) MG EC tablet Take 325 mg by mouth daily with breakfast.   Yes [provider]  glipiZIDE (GLUCOTROL XL) 5 MG 24 hr tablet Take 5 mg by mouth at bedtime.   Yes [provider]  metoprolol tartrate (LOPRESSOR) 25 MG tablet Take 25 mg by mouth 2 (two) times daily.   Yes [provider]  midodrine (PROAMATINE) 5 MG tablet Take 1 tablet (5 mg  total) by mouth 3 (three) times daily while awake with food as needed for dizziness 04/10/23  Yes Yates Decamp, MD  oxymetazoline (AFRIN) 0.05 % nasal spray Place 1 spray into both nostrils 2 (two) times daily as needed (nosebleeds). 12/07/23  Yes Kommor, Madison, MD  ticagrelor (BRILINTA) 90 MG TABS tablet Take 1 tablet (90 mg total) by mouth 2 (two) times daily. 04/21/23  Yes Patwardhan, Anabel Bene, MD  traMADol (ULTRAM) 50 MG tablet Take 50 mg by mouth every 6 (six) hours as needed for moderate pain (pain score 4-6). 07/30/23  Yes [provider]  nitroGLYCERIN (NITROSTAT) 0.4 MG SL tablet Place 1 tablet (0.4 mg total) under the tongue every 5 (five) minutes as needed for chest pain. 04/21/23 11/13/23  Elder Negus, MD    Family History Family History  Problem Relation Age of Onset   Heart attack Mother 77       first one   Heart disease Father    Hypertension Father    Hyperlipidemia Father    Heart disease Sister    Heart disease Sister     Social History Social History   Tobacco Use   Smoking status: Never   Smokeless tobacco: Never  Vaping Use   Vaping status: Never Used  Substance Use Topics   Alcohol use: No   Drug use: No     Allergies   Ramipril and Codeine   Review of Systems Review of Systems Per HPI  Physical Exam Triage Vital Signs ED Triage Vitals  Encounter Vitals Group     BP 01/26/24 1510 (!) 157/81     Systolic BP Percentile --      Diastolic BP Percentile --      Pulse Rate 01/26/24 1510 69     Resp 01/26/24 1510 18     Temp 01/26/24 1510 97.6 F (36.4 C)     Temp Source 01/26/24 1510 Oral     SpO2 01/26/24 1510 99 %     Weight --      Height --      Head Circumference --      Peak Flow --      Pain Score 01/26/24 1508 8     Pain Loc --      Pain Education --      Exclude from Growth Chart --    No data found.  Updated Vital Signs BP (!) 157/81 (BP Location: Right Arm)   Pulse 69   Temp 97.6 F (36.4 C) (Oral)   Resp 18    SpO2 99%   Visual Acuity Right Eye Distance:   Left Eye Distance:   Bilateral Distance:    Right Eye Near:   Left Eye Near:    Bilateral Near:     Physical Exam Vitals and nursing note reviewed.  Constitutional:      General: She is not in acute distress.    Appearance: Normal appearance.  HENT:     Mouth/Throat:     Pharynx: Oropharynx  is clear.  Cardiovascular:     Rate and Rhythm: Normal rate and regular rhythm.     Pulses: Normal pulses.     Heart sounds: Normal heart sounds.  Pulmonary:     Effort: Pulmonary effort is normal.     Breath sounds: Normal breath sounds.  Musculoskeletal:        General: Tenderness present. No swelling. Normal range of motion.       Legs:     Comments: Slight tenderness to palpation left popliteal space. Non tender down calf. No cords palpated, no swelling or erythema. Bilateral lower legs equal in size. Sensation normal, DP pulses 2+  Skin:    General: Skin is warm and dry.     Capillary Refill: Capillary refill takes less than 2 seconds.     Findings: No erythema.  Neurological:     Mental Status: She is alert and oriented to person, place, and time.      UC Treatments / Results  Labs (all labs ordered are listed, but only abnormal results are displayed) Labs Reviewed - No data to display  EKG   Radiology No results found.  Procedures Procedures (including critical care time)  Medications Ordered in UC Medications - No data to display  Initial Impression / Assessment and Plan / UC Course  I have reviewed the triage vital signs and the nursing notes.  Pertinent labs & imaging results that were available during my care of the patient were reviewed by me and considered in my medical decision making (see chart for details).  Popliteal pain  Ultrasound is ordered for tomorrow morning at 11 AM Patient will be referred to DVT clinic if positive findings Discussed other etiologies with patient Return and ED  precautions Agrees to plan, no questions   Final Clinical Impressions(s) / UC Diagnoses   Final diagnoses:  Popliteal pain     Discharge Instructions      Vein and Vascular center tomorrow morning at 11 AM Entrance C of [facility]  [address] [place], Entrance C     ED Prescriptions   None    PDMP not reviewed this encounter.   Jaziyah Gradel, Lurena Joiner, [place] 01/26/24 1558

## 2024-01-26 NOTE — ED Triage Notes (Signed)
 Pt states she has pain behind her left knee since yesterday. Its painful to walk, she doesn't recall any injury. She is on blood thinners, and worried about DVT. She is taking tylenol as needed.

## 2024-01-27 ENCOUNTER — Ambulatory Visit (HOSPITAL_COMMUNITY)
Admission: RE | Admit: 2024-01-27 | Discharge: 2024-01-27 | Disposition: A | Discharge status: Home / Self Care | Payer: Medicare (Managed Care) | Source: Ambulatory Visit | Attending: Emergency Medicine | Admitting: Emergency Medicine

## 2024-01-27 DIAGNOSIS — M25569 Pain in unspecified knee: Secondary | ICD-10-CM | POA: Insufficient documentation

## 2024-01-27 DIAGNOSIS — R2242 Localized swelling, mass and lump, left lower limb: Secondary | ICD-10-CM | POA: Diagnosis present

## 2024-01-27 DIAGNOSIS — M79605 Pain in left leg: Secondary | ICD-10-CM | POA: Diagnosis present

## 2024-03-09 ENCOUNTER — Other Ambulatory Visit (HOSPITAL_COMMUNITY): Payer: Self-pay

## 2024-03-10 ENCOUNTER — Other Ambulatory Visit (HOSPITAL_COMMUNITY): Payer: Self-pay

## 2024-04-01 ENCOUNTER — Encounter: Payer: Self-pay | Admitting: Pharmacist

## 2024-05-16 ENCOUNTER — Telehealth: Payer: Self-pay | Admitting: Cardiology

## 2024-05-16 MED ORDER — TICAGRELOR 90 MG PO TABS: 90.0000 mg | ORAL_TABLET | Freq: Two times a day (BID) | ORAL | 1 refills | Status: DC

## 2024-05-16 NOTE — Telephone Encounter (Signed)
*  STAT* If patient is at the pharmacy, call can be transferred to refill team.   1. Which medications need to be refilled? (please list name of each medication and dose if known) ticagrelor  (BRILINTA ) 90 MG TABS tablet   2. Which pharmacy/location (including street and city if local pharmacy) is medication to be sent to?  [facility] [address] - [place], [place] - [address]    3. Do they need a 30 day or 90 day supply? 90

## 2024-06-22 ENCOUNTER — Telehealth: Payer: Self-pay | Admitting: Pharmacist

## 2024-06-22 NOTE — Telephone Encounter (Signed)
 Called pt and LVM. Need to come in for repeat cholesterol labs after starting Praluent 

## 2024-06-22 NOTE — Telephone Encounter (Signed)
 Patient will get labs on 9/15. She is going to [facility] for labs for kidney MD. I advised her to let them know there is a lipid panel under my name. Has been having issues giving Praluent  injection. She is going to try again and let me know. She has gotten some to work. Reminded her to not try and pinch and firmly hold to skin.

## 2024-07-04 ENCOUNTER — Other Ambulatory Visit: Payer: Self-pay | Admitting: Cardiology

## 2024-07-04 NOTE — Telephone Encounter (Signed)
Please have PCP fill prescription.

## 2024-07-04 NOTE — Telephone Encounter (Signed)
 Pt is requesting a refill on non cardiac medication glipizide. Would Dr. Elmira like to refill this medication? Please address

## 2024-07-04 NOTE — Telephone Encounter (Signed)
*  STAT* If patient is at the pharmacy, call can be transferred to refill team.   1. Which medications need to be refilled? (please list name of each medication and dose if known) glipiZIDE (GLUCOTROL XL) 5 MG 24 hr tablet   2. Which pharmacy/location (including street and city if local pharmacy) is medication to be sent to?  [facility] [address] - [place], [place] - [address]    3. Do they need a 30 day or 90 day supply? 90

## 2024-07-05 LAB — COMPREHENSIVE METABOLIC PANEL WITH GFR
ALT: 10 IU/L (ref 0–32)
AST: 13 IU/L (ref 0–40)
Albumin: 4.2 g/dL (ref 3.9–4.9)
Alkaline Phosphatase: 100 IU/L (ref 51–125)
BUN/Creatinine Ratio: 16 (ref 12–28)
BUN: 31 mg/dL — ABNORMAL HIGH (ref 8–27)
Bilirubin Total: 0.5 mg/dL (ref 0.0–1.2)
CO2: 21 mmol/L (ref 20–29)
Calcium: 10 mg/dL (ref 8.7–10.3)
Chloride: 100 mmol/L (ref 96–106)
Creatinine, Ser: 1.91 mg/dL — ABNORMAL HIGH (ref 0.57–1.00)
Globulin, Total: 4 g/dL (ref 1.5–4.5)
Glucose: 259 mg/dL — ABNORMAL HIGH (ref 70–99)
Potassium: 4.7 mmol/L (ref 3.5–5.2)
Sodium: 135 mmol/L (ref 134–144)
Total Protein: 8.2 g/dL (ref 6.0–8.5)
eGFR: 28 mL/min/1.73 — ABNORMAL LOW (ref >59)

## 2024-07-05 LAB — LIPID PANEL
Chol/HDL Ratio: 4 ratio (ref 0.0–4.4)
Cholesterol, Total: 202 mg/dL — ABNORMAL HIGH (ref 100–199)
HDL: 50 mg/dL (ref >39)
LDL Chol Calc (NIH): 127 mg/dL — ABNORMAL HIGH (ref 0–99)
Triglycerides: 140 mg/dL (ref 0–149)
VLDL Cholesterol Cal: 25 mg/dL (ref 5–40)

## 2024-07-07 ENCOUNTER — Ambulatory Visit: Payer: Self-pay | Admitting: Pharmacist

## 2024-07-22 ENCOUNTER — Encounter: Payer: Self-pay | Admitting: Pharmacist

## 2024-07-22 ENCOUNTER — Ambulatory Visit: Payer: Medicare (Managed Care) | Attending: Cardiology | Admitting: Pharmacist

## 2024-07-22 ENCOUNTER — Telehealth: Payer: Self-pay | Admitting: Pharmacist

## 2024-07-22 ENCOUNTER — Other Ambulatory Visit (HOSPITAL_COMMUNITY): Payer: Self-pay

## 2024-07-22 DIAGNOSIS — E7849 Other hyperlipidemia: Secondary | ICD-10-CM | POA: Diagnosis not present

## 2024-07-22 MED ORDER — EZETIMIBE 10 MG PO TABS: 10.0000 mg | ORAL_TABLET | Freq: Every day | ORAL | 3 refills | Status: AC

## 2024-07-22 MED ORDER — NEXLETOL 180 MG PO TABS: 180.0000 mg | ORAL_TABLET | Freq: Every day | ORAL | 11 refills | Status: AC

## 2024-07-22 NOTE — Progress Notes (Signed)
 Patient ID: Patricia Mooney                 DOB: 06-29-1956                    MRN: [id number]      HPI: Patricia Mooney is a 68 y.o. female patient referred to lipid clinic by Dr.Patwardhan. PMH is significant for hypertension, hyperlipidemia, type 2 DM, s/p Rt nephrectomy, CAD, HFrEF with now recovered EF.   Patient was admitted in 03/2023 with ACS/NSTEMI, found to have reduced LVEF. Cath showed severe multivessel CAD. Patient underwent successful PCI to left circumflex. Patient was admitted to [facility] in 06/2023 with chest pain. ACS was excluded. However, on further workup, left circumflex stents were open, and LAD disease was unchanged. Given that her EF had improved, it was felt that there is viability in her LAD territory. Therefore, she underwent LIMA-LAD CABG via lateral thoracotomy surgery by Dr. Burnie.    She was hospitalized in 09/2023 with severe leg pains and was found to have rhabdomyolysis along with AKI, elevated CK, that was attributed to statin use. Rosuvastatin  was since discontinued.   Patient was seen by Eleanor Crews, PharmD 12/2023 Praluent  was started but patient is having hard time using device. Patient presented today in good spirit. Reports she does not want to stay on injectable therapy. Since March she used it only 3 times. She wanted to be on oral medication. Reviewed options for lowering LDL cholesterol, including ezetimibe, inhibitors, bempedoic acid .  Discussed mechanisms of action, dosing, side effects and potential decreases in LDL cholesterol.  Also reviewed cost information and potential options for patient assistance.  Nexlitol is not on formulary from quick search we will assess coverage     Current Medications: none  Intolerances: rosuvastatin  -rhabdomyolysis along with AKI, elevated CK, Praluent  - does not like injections  Risk Factors: hypertension, hyperlipidemia, type 2 DM,CAD LDL goal: <70 or <55 mg/dl  Last lab 90/84/7974 TC 202, TG 140, HDL 50,  LDLc 127  Diet: portion control diet, reduce salt intake  B- egg whites, cheerios, turnip  L-  skips  D- white rice, chicken -baked  Eats out- red beans and rice - every 2 weeks  Snacks: none  Drink: water, coffee with splenda and 2% milk    Exercise: walking  1.5 miles for 30 min -everyday, resistance bands - every day   Family History:   Relation Problem Comments  Mother (Deceased) Heart attack (Age: 50) first one    Father (Deceased) Heart disease   Hyperlipidemia   Hypertension     Sister (Deceased) Heart disease     Sister Metallurgist) Heart disease     Sister Metallurgist)   Brother Metallurgist)   Brother Metallurgist)   Brother Metallurgist)   Brother (Alive)      Labs:  Lipid Panel     Component Value Date/Time   CHOL 202 (H) 07/04/2024 0920   TRIG 140 07/04/2024 0920   HDL 50 07/04/2024 0920   CHOLHDL 4.0 07/04/2024 0920   CHOLHDL 4.1 04/04/2023 0620   VLDL 17 04/04/2023 0620   LDLCALC 127 (H) 07/04/2024 0920   LABVLDL 25 07/04/2024 0920    Past Medical History:  Diagnosis Date   Hypercholesteremia    Hypertension    Periorbital edema of right eye admitted 10/07/2016   Type II diabetes mellitus (HCC)     Current Outpatient Medications on File Prior to Visit  Medication Sig Dispense Refill  acetaminophen  (TYLENOL ) 500 MG tablet Take 1,000 mg by mouth every 8 (eight) hours as needed for moderate pain (pain score 4-6).     Alirocumab  (PRALUENT ) 150 MG/ML SOAJ Inject 1 mL (150 mg total) into the skin every 14 (fourteen) days. 6 mL 3   ASPIRIN  LOW DOSE 81 MG tablet Take 1 tablet (81 mg total) by mouth daily. 90 tablet 3   cholecalciferol  (VITAMIN D ) 1000 units tablet Take 1,000 Units by mouth daily.     diclofenac Sodium (VOLTAREN) 1 % GEL Apply 1 g topically 3 (three) times daily.     famotidine (PEPCID) 10 MG tablet Take 10 mg by mouth daily.     ferrous sulfate 325 (65 FE) MG EC tablet Take 325 mg by mouth daily with breakfast.     glipiZIDE (GLUCOTROL XL) 5 MG 24 hr  tablet Take 5 mg by mouth at bedtime.     metoprolol  tartrate (LOPRESSOR ) 25 MG tablet Take 25 mg by mouth 2 (two) times daily.     midodrine  (PROAMATINE ) 5 MG tablet Take 1 tablet (5 mg total) by mouth 3 (three) times daily while awake with food as needed for dizziness 90 tablet 0   nitroGLYCERIN  (NITROSTAT ) 0.4 MG SL tablet Place 1 tablet (0.4 mg total) under the tongue every 5 (five) minutes as needed for chest pain. 30 tablet 3   oxymetazoline  (AFRIN) 0.05 % nasal spray Place 1 spray into both nostrils 2 (two) times daily as needed (nosebleeds). 30 mL 0   ticagrelor  (BRILINTA ) 90 MG TABS tablet Take 1 tablet (90 mg total) by mouth 2 (two) times daily. 180 tablet 1   traMADol (ULTRAM) 50 MG tablet Take 50 mg by mouth every 6 (six) hours as needed for moderate pain (pain score 4-6).     No current facility-administered medications on file prior to visit.    Allergies  Allergen Reactions   Ramipril Swelling    Facial swelling after 1 pill.   Codeine Nausea Only    Assessment/Plan:  1. Hyperlipidemia -  Problem  Hyperlipidemia   Current Medications: none Intolerances: statins (rhabdo) rosuvastatin  Risk Factors: CAD, DM, HTN LDL-C goal: <55 ApoB goal: <60    Hyperlipidemia Assessment: LDL-C above goal of less than 55 Patient experienced rhabdo on rosuvastatin  therefore further use of statins is contraindicated Discussed options including Praluent  and Nexlizet Doesn't not want to be on injectables - Praluent  was prescribed March 2025 but using it Willing to try any other oral medication  Follows fairly healthy diet and exercise regularly Insurance will not cover nexletol - plan excluded      Plan: Patient will come to the office for first few injection until she gets comfortable doing injection at home Patient to start Zetia 10 mg daily  Labs in 3 months     Thank you,  Robbi Blanch, Pharm.D [facility]. [facility] [address], [place], [place] [address] Phone: [phone]; Fax: [phone]

## 2024-07-22 NOTE — Telephone Encounter (Signed)
 Pharmacy Patient Advocate Encounter   Received notification from Physician's Office that prior authorization for NEXLETOL is required/requested.   Insurance verification completed.   The patient is insured through [facility] MEDICARE.   Per test claim: Per test claim, medication is not covered due to plan/benefit exclusion, PA not submitted at this time.

## 2024-07-22 NOTE — Assessment & Plan Note (Signed)
 Assessment: LDL-C above goal of less than 55 Patient experienced rhabdo on rosuvastatin  therefore further use of statins is contraindicated Discussed options including Praluent  and Nexlizet Doesn't not want to be on injectables - Praluent  was prescribed March 2025 but using it Willing to try any other oral medication  Follows fairly healthy diet and exercise regularly Insurance will not cover nexletol - plan excluded      Plan: Patient will come to the office for first few injection until she gets comfortable doing injection at home Patient to start Zetia 10 mg daily  Labs in 3 months

## 2024-07-22 NOTE — Patient Instructions (Addendum)
 SABRA

## 2024-07-23 LAB — URIC ACID: Uric Acid: 8.5 mg/dL — ABNORMAL HIGH (ref 3.0–7.2)

## 2024-07-25 ENCOUNTER — Ambulatory Visit: Payer: Self-pay | Admitting: Pharmacist

## 2024-07-25 NOTE — Telephone Encounter (Signed)
 Patient informed via call. See other encounter for more details.

## 2024-07-25 NOTE — Telephone Encounter (Signed)
 Call to discuss lab result and let patient know Nexletol would not be appropriate and it is plan excluded. N/A LVM to call back  Next step can be continue with Praluent  will train/supervise first couple doses in the office so pt get confident to do self injection.

## 2024-07-29 ENCOUNTER — Ambulatory Visit: Payer: Medicare (Managed Care) | Admitting: Pharmacist

## 2024-07-29 DIAGNOSIS — E785 Hyperlipidemia, unspecified: Secondary | ICD-10-CM

## 2024-07-29 NOTE — Progress Notes (Signed)
 Patient was in to received Praluent  injection training and her 1st dose. She has self administered the Praluent  injection successfully under supervision.

## 2024-08-11 LAB — COLOGUARD: COLOGUARD: NEGATIVE

## 2024-11-16 ENCOUNTER — Other Ambulatory Visit: Payer: Self-pay | Admitting: Cardiology

## 2024-11-17 ENCOUNTER — Telehealth: Payer: Self-pay | Admitting: Cardiology

## 2024-11-17 NOTE — Telephone Encounter (Signed)
" °*  STAT* If patient is at the pharmacy, call can be transferred to refill team.   1. Which medications need to be refilled? (please list name of each medication and dose if known)   ticagrelor  (BRILINTA ) 90 MG TABS tablet     2. Would you like to learn more about the convenience, safety, & potential cost savings by using the [facility]? No    3. Are you open to using the [facility] (Type [facility]. ). No    4. Which pharmacy/location (including street and city if local pharmacy) is medication to be sent to? [facility] [address] - [place], [place] - [address]     5. Do they need a 30 day or 90 day supply? 90  Pt has upcoming appt   "

## 2024-11-18 MED ORDER — TICAGRELOR 90 MG PO TABS: 90.0000 mg | ORAL_TABLET | Freq: Two times a day (BID) | ORAL | 0 refills | Status: AC

## 2024-11-18 NOTE — Telephone Encounter (Signed)
 Refill sent

## 2024-12-22 ENCOUNTER — Ambulatory Visit: Payer: Medicare (Managed Care) | Admitting: Cardiology
